# Patient Record
Sex: Female | Born: 1940 | Race: White | Hispanic: No | Marital: Married | Smoking: Never smoker
Health system: Southern US, Community
[De-identification: ages and names within clinical notes are randomized; demographics above are authoritative.]

## PROBLEM LIST (undated history)

## (undated) DIAGNOSIS — Z780 Asymptomatic menopausal state: Secondary | ICD-10-CM

## (undated) DIAGNOSIS — Z79890 Hormone replacement therapy (postmenopausal): Secondary | ICD-10-CM

## (undated) DIAGNOSIS — E785 Hyperlipidemia, unspecified: Secondary | ICD-10-CM

## (undated) DIAGNOSIS — E079 Disorder of thyroid, unspecified: Secondary | ICD-10-CM

## (undated) DIAGNOSIS — M8580 Other specified disorders of bone density and structure, unspecified site: Secondary | ICD-10-CM

## (undated) HISTORY — PX: ABDOMINAL HYSTERECTOMY: SHX81

## (undated) HISTORY — DX: Hyperlipidemia, unspecified: E78.5

## (undated) HISTORY — DX: Asymptomatic menopausal state: Z78.0

## (undated) HISTORY — PX: SPINE SURGERY: SHX786

## (undated) HISTORY — DX: Hormone replacement therapy: Z79.890

## (undated) HISTORY — DX: Disorder of thyroid, unspecified: E07.9

## (undated) HISTORY — DX: Other specified disorders of bone density and structure, unspecified site: M85.80

---

## 1998-03-25 ENCOUNTER — Ambulatory Visit (HOSPITAL_COMMUNITY): Admission: RE | Admit: 1998-03-25 | Discharge: 1998-03-25 | Payer: Self-pay | Admitting: *Deleted

## 1999-08-15 ENCOUNTER — Ambulatory Visit (HOSPITAL_COMMUNITY): Admission: RE | Admit: 1999-08-15 | Discharge: 1999-08-15 | Payer: Self-pay | Admitting: *Deleted

## 1999-08-15 ENCOUNTER — Encounter: Payer: Self-pay | Admitting: *Deleted

## 1999-09-08 ENCOUNTER — Other Ambulatory Visit: Admission: RE | Admit: 1999-09-08 | Discharge: 1999-09-08 | Payer: Self-pay | Admitting: *Deleted

## 2000-08-20 ENCOUNTER — Ambulatory Visit (HOSPITAL_COMMUNITY): Admission: RE | Admit: 2000-08-20 | Discharge: 2000-08-20 | Payer: Self-pay | Admitting: Internal Medicine

## 2000-08-20 ENCOUNTER — Encounter: Payer: Self-pay | Admitting: Internal Medicine

## 2001-09-09 ENCOUNTER — Ambulatory Visit (HOSPITAL_COMMUNITY): Admission: RE | Admit: 2001-09-09 | Discharge: 2001-09-09 | Payer: Self-pay | Admitting: Internal Medicine

## 2001-09-09 ENCOUNTER — Encounter: Payer: Self-pay | Admitting: Internal Medicine

## 2002-10-19 ENCOUNTER — Encounter: Payer: Self-pay | Admitting: Internal Medicine

## 2002-10-19 ENCOUNTER — Ambulatory Visit (HOSPITAL_COMMUNITY): Admission: RE | Admit: 2002-10-19 | Discharge: 2002-10-19 | Payer: Self-pay | Admitting: Internal Medicine

## 2003-10-26 ENCOUNTER — Ambulatory Visit (HOSPITAL_COMMUNITY): Admission: RE | Admit: 2003-10-26 | Discharge: 2003-10-26 | Payer: Self-pay | Admitting: Internal Medicine

## 2004-08-30 ENCOUNTER — Ambulatory Visit: Payer: Self-pay | Admitting: Internal Medicine

## 2004-09-15 ENCOUNTER — Ambulatory Visit: Payer: Self-pay | Admitting: Internal Medicine

## 2004-10-10 ENCOUNTER — Ambulatory Visit: Payer: Self-pay | Admitting: Internal Medicine

## 2004-11-13 ENCOUNTER — Ambulatory Visit: Payer: Self-pay | Admitting: Internal Medicine

## 2005-01-22 ENCOUNTER — Ambulatory Visit: Payer: Self-pay | Admitting: Internal Medicine

## 2005-01-23 ENCOUNTER — Ambulatory Visit (HOSPITAL_COMMUNITY): Admission: RE | Admit: 2005-01-23 | Discharge: 2005-01-23 | Payer: Self-pay | Admitting: Internal Medicine

## 2005-08-07 ENCOUNTER — Ambulatory Visit: Payer: Self-pay | Admitting: Internal Medicine

## 2005-12-27 ENCOUNTER — Ambulatory Visit: Payer: Self-pay | Admitting: Internal Medicine

## 2006-01-03 ENCOUNTER — Ambulatory Visit: Payer: Self-pay | Admitting: Internal Medicine

## 2006-01-10 ENCOUNTER — Encounter: Admission: RE | Admit: 2006-01-10 | Discharge: 2006-01-10 | Payer: Self-pay | Admitting: Internal Medicine

## 2006-01-29 ENCOUNTER — Encounter: Admission: RE | Admit: 2006-01-29 | Discharge: 2006-01-29 | Payer: Self-pay | Admitting: Internal Medicine

## 2006-07-02 ENCOUNTER — Other Ambulatory Visit: Admission: RE | Admit: 2006-07-02 | Discharge: 2006-07-02 | Payer: Self-pay | Admitting: Internal Medicine

## 2006-07-02 ENCOUNTER — Ambulatory Visit: Payer: Self-pay | Admitting: Internal Medicine

## 2006-07-02 ENCOUNTER — Ambulatory Visit (HOSPITAL_COMMUNITY): Admission: RE | Admit: 2006-07-02 | Discharge: 2006-07-02 | Payer: Self-pay | Admitting: Internal Medicine

## 2006-07-08 ENCOUNTER — Ambulatory Visit: Payer: Self-pay | Admitting: Internal Medicine

## 2007-06-19 DIAGNOSIS — M949 Disorder of cartilage, unspecified: Secondary | ICD-10-CM

## 2007-06-19 DIAGNOSIS — M899 Disorder of bone, unspecified: Secondary | ICD-10-CM | POA: Insufficient documentation

## 2007-06-19 DIAGNOSIS — E039 Hypothyroidism, unspecified: Secondary | ICD-10-CM | POA: Insufficient documentation

## 2007-06-19 DIAGNOSIS — E785 Hyperlipidemia, unspecified: Secondary | ICD-10-CM

## 2007-07-16 ENCOUNTER — Telehealth: Payer: Self-pay | Admitting: Internal Medicine

## 2007-07-18 ENCOUNTER — Ambulatory Visit: Payer: Self-pay | Admitting: Internal Medicine

## 2007-07-18 LAB — CONVERTED CEMR LAB
Basophils Absolute: 0 10*3/uL (ref 0.0–0.1)
Bilirubin, Direct: 0.1 mg/dL (ref 0.0–0.3)
Calcium: 8.7 mg/dL (ref 8.4–10.5)
Chloride: 108 meq/L (ref 96–112)
Cholesterol: 196 mg/dL (ref 0–200)
Creatinine, Ser: 0.8 mg/dL (ref 0.4–1.2)
Direct LDL: 108.6 mg/dL
Eosinophils Relative: 2.8 % (ref 0.0–5.0)
GFR calc Af Amer: 92 mL/min
GFR calc non Af Amer: 76 mL/min
Glucose, Bld: 104 mg/dL — ABNORMAL HIGH (ref 70–99)
HCT: 37.6 % (ref 36.0–46.0)
Hemoglobin: 12.9 g/dL (ref 12.0–15.0)
Lymphocytes Relative: 33.3 % (ref 12.0–46.0)
Monocytes Relative: 8.7 % (ref 3.0–11.0)
Neutrophils Relative %: 54.8 % (ref 43.0–77.0)
Nitrite: NEGATIVE
Platelets: 223 10*3/uL (ref 150–400)
Total CHOL/HDL Ratio: 4.7
Triglycerides: 253 mg/dL (ref 0–149)
WBC: 7.3 10*3/uL (ref 4.5–10.5)

## 2007-08-05 ENCOUNTER — Ambulatory Visit: Payer: Self-pay | Admitting: Internal Medicine

## 2007-09-12 IMAGING — CR DG FOOT COMPLETE 3+V*L*
3 series · 3 of 3 positions shown · non-contrast
Comparison: None.

CLINICAL DATA: Post fall injury with lateral left foot pain and swelling.
 LEFT FOOT, THREE VIEWS:

[view not recorded (1 of 3)]
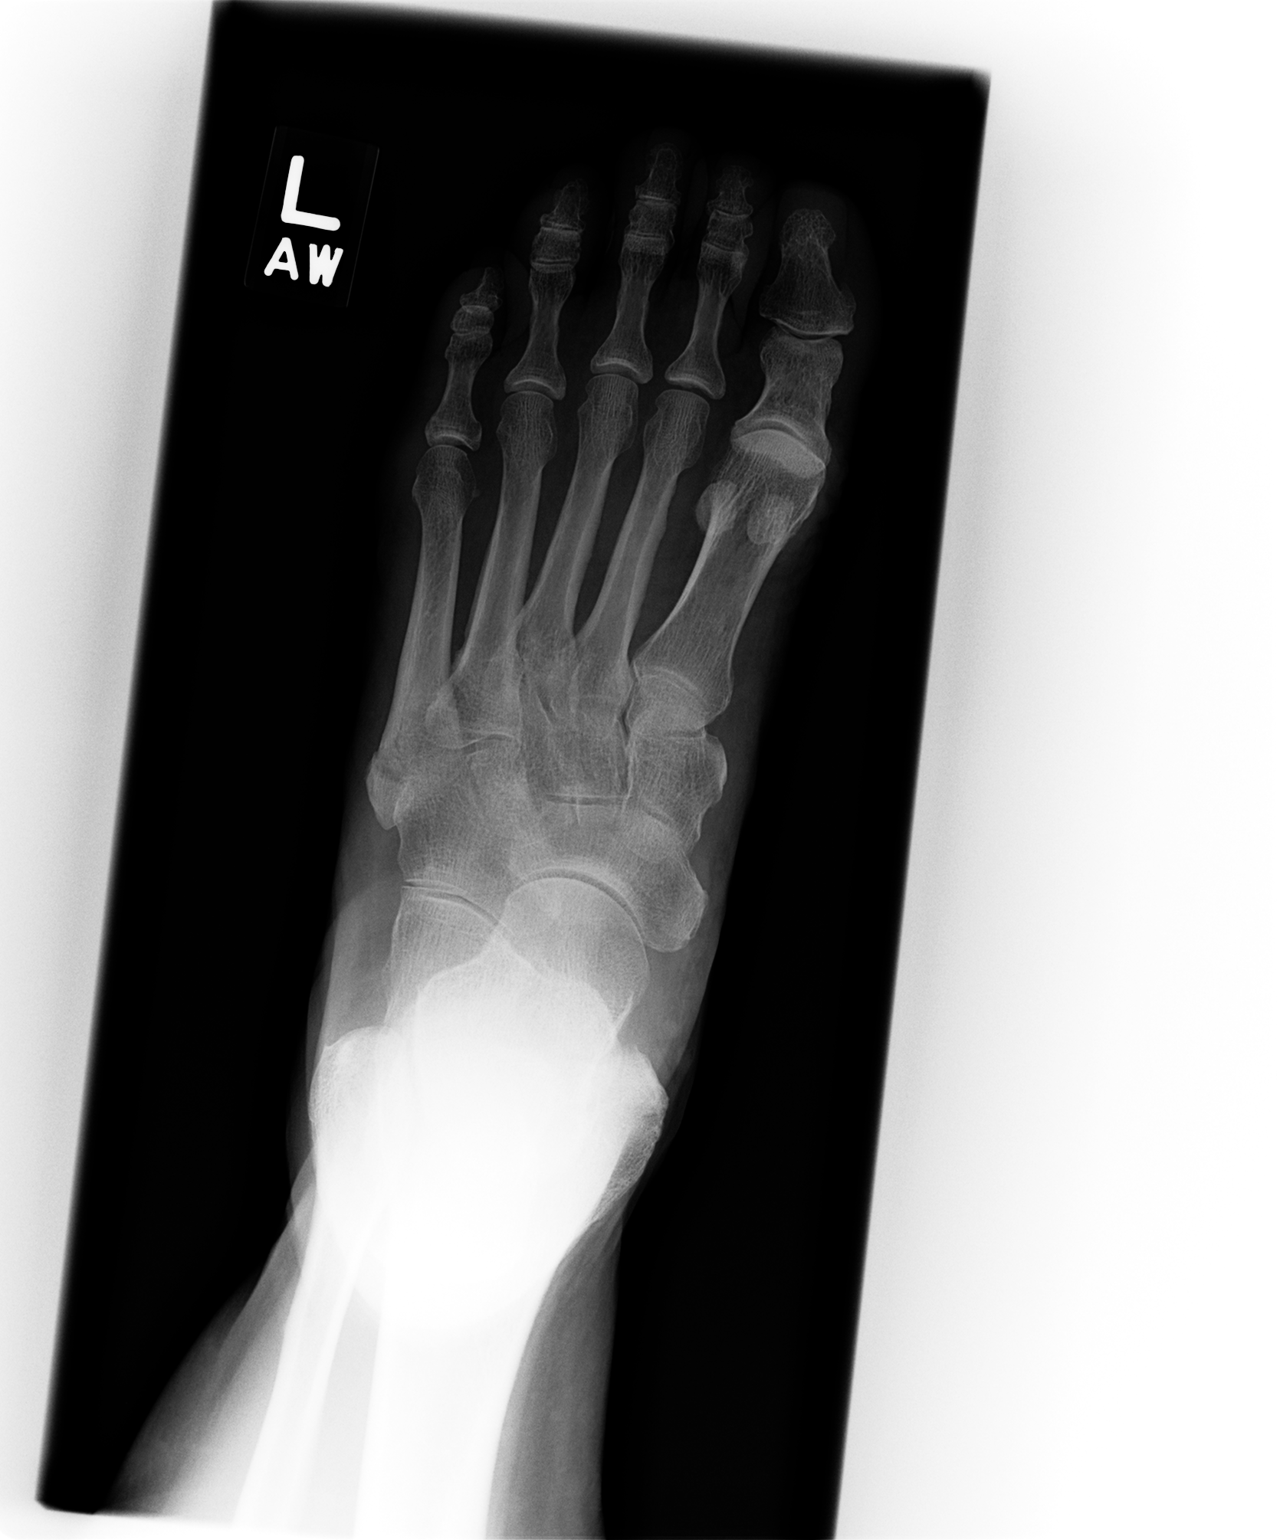

[view not recorded (2 of 3)]
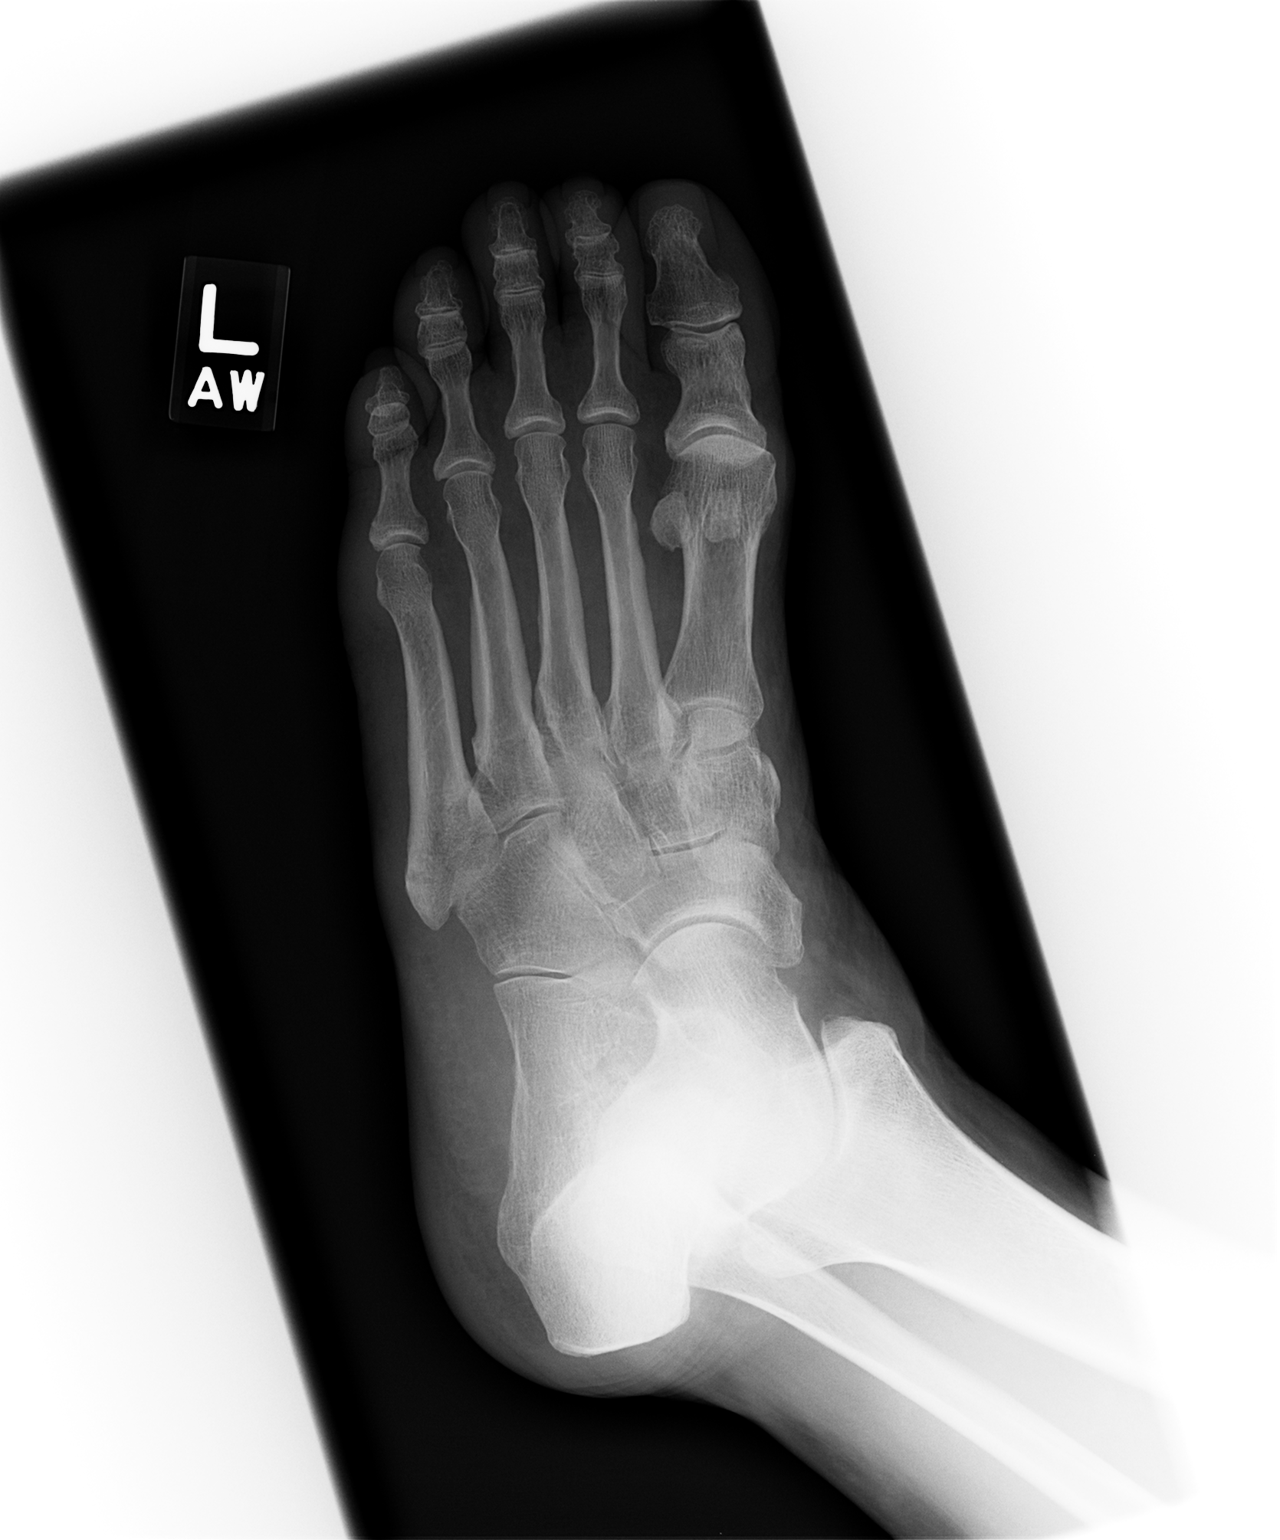

[view not recorded (3 of 3)]
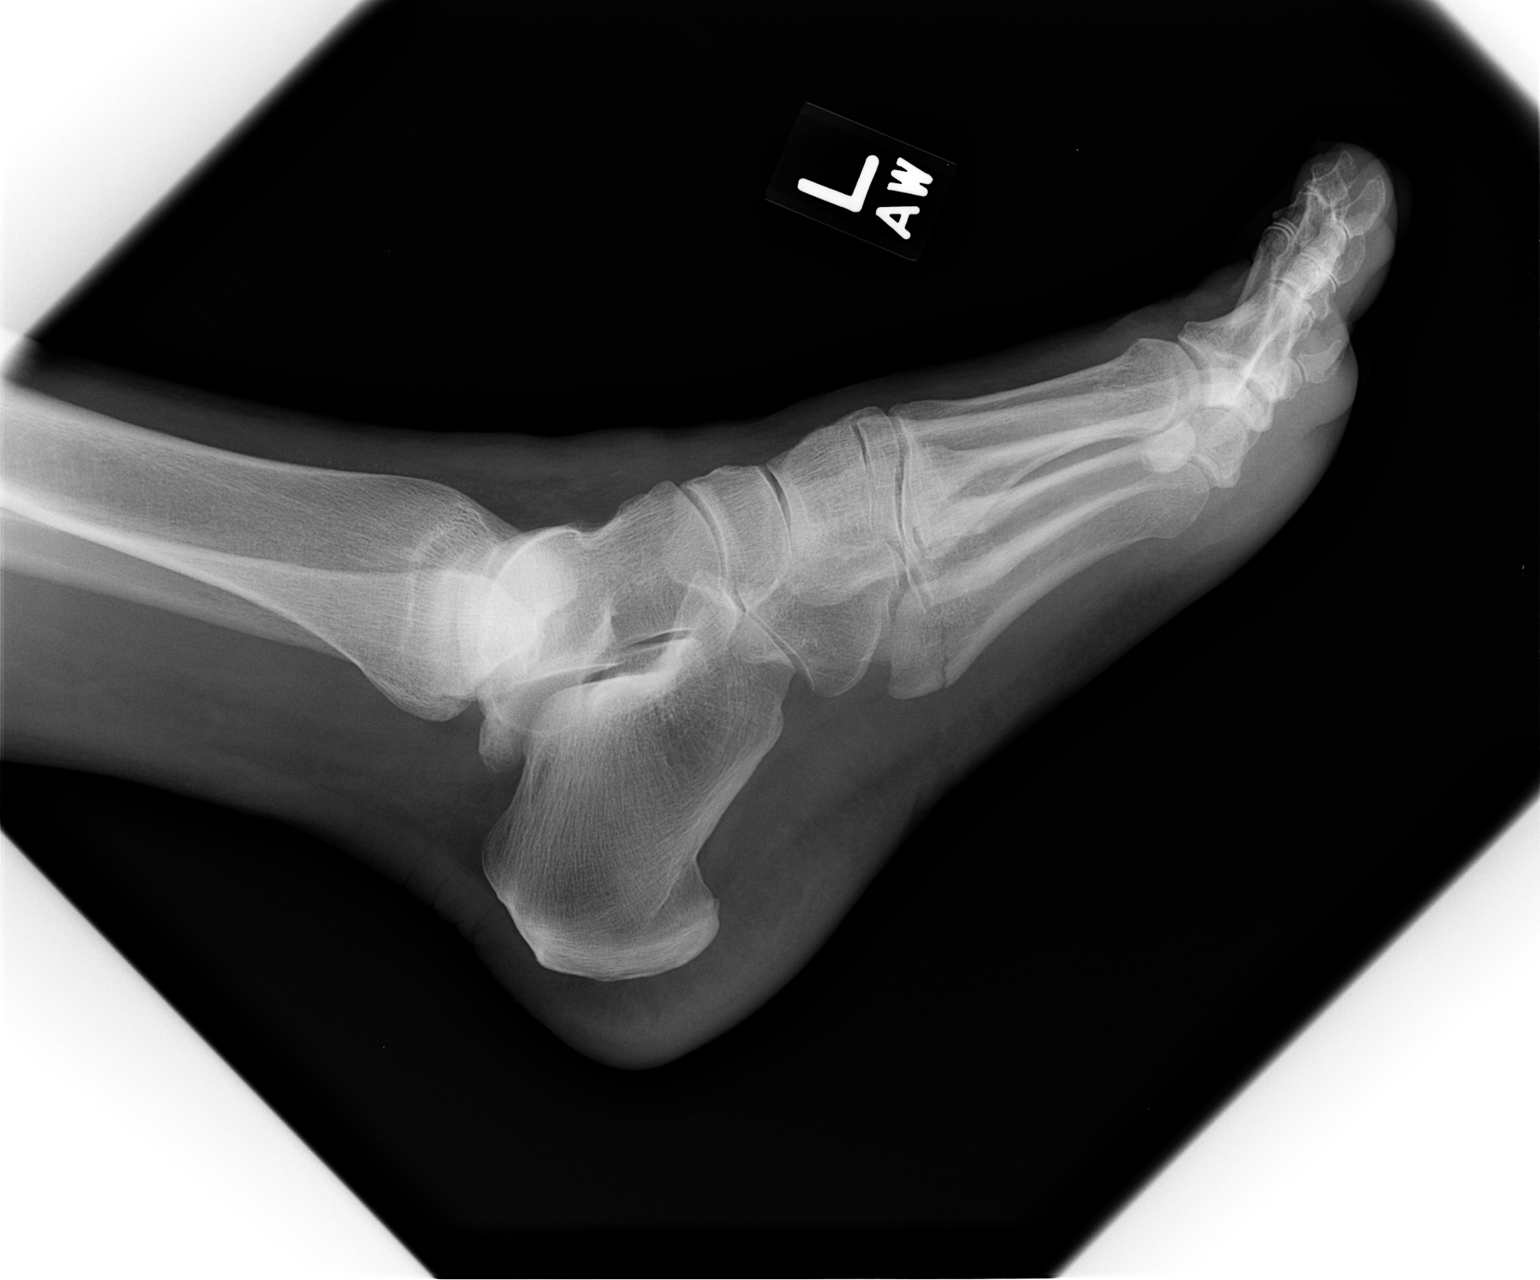

[3 of 3 positions shown; findings below may reference images not displayed]

FINDINGS: Nondisplaced acute fracture is seen at the base of the left fifth metatarsal.  No other significant abnormality is seen.
IMPRESSION: 1.  Nondisplaced left fifth base of metatarsal fracture.
 2.  Otherwise no significant abnormality.

## 2007-11-05 ENCOUNTER — Telehealth: Payer: Self-pay | Admitting: Internal Medicine

## 2007-11-05 ENCOUNTER — Encounter: Payer: Self-pay | Admitting: Internal Medicine

## 2007-11-06 ENCOUNTER — Encounter: Payer: Self-pay | Admitting: Internal Medicine

## 2007-11-10 ENCOUNTER — Encounter: Payer: Self-pay | Admitting: Internal Medicine

## 2007-11-20 ENCOUNTER — Encounter: Payer: Self-pay | Admitting: Internal Medicine

## 2007-12-22 ENCOUNTER — Telehealth: Payer: Self-pay | Admitting: Internal Medicine

## 2007-12-24 ENCOUNTER — Telehealth: Payer: Self-pay | Admitting: Internal Medicine

## 2008-04-05 ENCOUNTER — Telehealth: Payer: Self-pay | Admitting: Internal Medicine

## 2008-04-08 ENCOUNTER — Ambulatory Visit: Payer: Self-pay | Admitting: Internal Medicine

## 2008-04-08 DIAGNOSIS — IMO0002 Reserved for concepts with insufficient information to code with codable children: Secondary | ICD-10-CM

## 2008-04-08 DIAGNOSIS — T887XXA Unspecified adverse effect of drug or medicament, initial encounter: Secondary | ICD-10-CM | POA: Insufficient documentation

## 2008-04-23 ENCOUNTER — Encounter: Payer: Self-pay | Admitting: Internal Medicine

## 2008-04-23 ENCOUNTER — Encounter: Admission: RE | Admit: 2008-04-23 | Discharge: 2008-04-23 | Payer: Self-pay | Admitting: Internal Medicine

## 2008-04-26 ENCOUNTER — Telehealth: Payer: Self-pay | Admitting: Internal Medicine

## 2008-04-29 ENCOUNTER — Telehealth: Payer: Self-pay | Admitting: Internal Medicine

## 2008-05-04 ENCOUNTER — Encounter: Payer: Self-pay | Admitting: Internal Medicine

## 2008-05-28 ENCOUNTER — Encounter: Payer: Self-pay | Admitting: Internal Medicine

## 2008-06-04 ENCOUNTER — Ambulatory Visit: Payer: Self-pay | Admitting: Internal Medicine

## 2008-06-04 DIAGNOSIS — B070 Plantar wart: Secondary | ICD-10-CM

## 2008-06-04 DIAGNOSIS — G4700 Insomnia, unspecified: Secondary | ICD-10-CM | POA: Insufficient documentation

## 2008-08-10 ENCOUNTER — Ambulatory Visit: Payer: Self-pay | Admitting: Internal Medicine

## 2008-08-10 LAB — CONVERTED CEMR LAB
Alkaline Phosphatase: 72 units/L (ref 39–117)
Basophils Absolute: 0.1 10*3/uL (ref 0.0–0.1)
CO2: 31 meq/L (ref 19–32)
Cholesterol: 178 mg/dL (ref 0–200)
Creatinine, Ser: 0.7 mg/dL (ref 0.4–1.2)
Eosinophils Relative: 2.4 % (ref 0.0–5.0)
GFR calc Af Amer: 107 mL/min
Glucose, Bld: 91 mg/dL (ref 70–99)
HCT: 40.3 % (ref 36.0–46.0)
Hemoglobin: 13.4 g/dL (ref 12.0–15.0)
LDL Cholesterol: 94 mg/dL (ref 0–99)
Lymphocytes Relative: 30.3 % (ref 12.0–46.0)
Monocytes Relative: 7.8 % (ref 3.0–12.0)
Neutro Abs: 4.1 10*3/uL (ref 1.4–7.7)
Potassium: 4.5 meq/L (ref 3.5–5.1)
RBC: 5.01 M/uL (ref 3.87–5.11)
Sodium: 144 meq/L (ref 135–145)
TSH: 1.7 microintl units/mL (ref 0.35–5.50)
Total CHOL/HDL Ratio: 4
Total Protein: 7.2 g/dL (ref 6.0–8.3)
Triglycerides: 200 mg/dL — ABNORMAL HIGH (ref 0–149)
VLDL: 40 mg/dL (ref 0–40)
WBC: 7 10*3/uL (ref 4.5–10.5)

## 2008-08-30 ENCOUNTER — Ambulatory Visit (HOSPITAL_COMMUNITY): Admission: RE | Admit: 2008-08-30 | Discharge: 2008-08-30 | Payer: Self-pay | Admitting: Specialist

## 2008-09-20 ENCOUNTER — Ambulatory Visit: Payer: Self-pay | Admitting: Gastroenterology

## 2008-10-11 ENCOUNTER — Ambulatory Visit: Payer: Self-pay | Admitting: Gastroenterology

## 2008-11-25 ENCOUNTER — Telehealth: Payer: Self-pay | Admitting: Internal Medicine

## 2009-02-21 ENCOUNTER — Ambulatory Visit: Payer: Self-pay | Admitting: Internal Medicine

## 2009-02-21 DIAGNOSIS — R030 Elevated blood-pressure reading, without diagnosis of hypertension: Secondary | ICD-10-CM

## 2009-02-21 DIAGNOSIS — R635 Abnormal weight gain: Secondary | ICD-10-CM

## 2009-02-28 LAB — CONVERTED CEMR LAB
Chloride: 109 meq/L (ref 96–112)
Creatinine, Ser: 0.7 mg/dL (ref 0.4–1.2)
Free T4: 0.9 ng/dL (ref 0.6–1.6)
TSH: 1.43 microintl units/mL (ref 0.35–5.50)

## 2009-03-21 ENCOUNTER — Ambulatory Visit: Payer: Self-pay | Admitting: Internal Medicine

## 2009-05-23 ENCOUNTER — Ambulatory Visit: Payer: Self-pay | Admitting: Internal Medicine

## 2009-05-23 DIAGNOSIS — C4490 Unspecified malignant neoplasm of skin, unspecified: Secondary | ICD-10-CM

## 2009-05-30 ENCOUNTER — Ambulatory Visit: Payer: Self-pay | Admitting: Internal Medicine

## 2009-05-30 DIAGNOSIS — D179 Benign lipomatous neoplasm, unspecified: Secondary | ICD-10-CM | POA: Insufficient documentation

## 2009-05-30 DIAGNOSIS — C439 Malignant melanoma of skin, unspecified: Secondary | ICD-10-CM | POA: Insufficient documentation

## 2009-06-14 ENCOUNTER — Encounter: Payer: Self-pay | Admitting: Internal Medicine

## 2009-06-24 ENCOUNTER — Telehealth: Payer: Self-pay | Admitting: Internal Medicine

## 2009-08-31 ENCOUNTER — Ambulatory Visit (HOSPITAL_COMMUNITY): Admission: RE | Admit: 2009-08-31 | Discharge: 2009-08-31 | Payer: Self-pay | Admitting: Internal Medicine

## 2009-09-06 ENCOUNTER — Ambulatory Visit: Payer: Self-pay | Admitting: Internal Medicine

## 2009-09-06 DIAGNOSIS — L570 Actinic keratosis: Secondary | ICD-10-CM

## 2009-09-07 LAB — CONVERTED CEMR LAB
ALT: 25 units/L (ref 0–35)
AST: 25 units/L (ref 0–37)
Albumin: 4.1 g/dL (ref 3.5–5.2)
Alkaline Phosphatase: 58 units/L (ref 39–117)
BUN: 12 mg/dL (ref 6–23)
Basophils Relative: 0.7 % (ref 0.0–3.0)
Bilirubin, Direct: 0 mg/dL (ref 0.0–0.3)
CO2: 30 meq/L (ref 19–32)
Cholesterol: 201 mg/dL — ABNORMAL HIGH (ref 0–200)
Eosinophils Relative: 2.3 % (ref 0.0–5.0)
GFR calc non Af Amer: 88.29 mL/min (ref >60)
Glucose, Bld: 92 mg/dL (ref 70–99)
Lymphs Abs: 2.5 10*3/uL (ref 0.7–4.0)
MCHC: 33 g/dL (ref 30.0–36.0)
Monocytes Relative: 6.9 % (ref 3.0–12.0)
Neutro Abs: 3.8 10*3/uL (ref 1.4–7.7)
Neutrophils Relative %: 54.5 % (ref 43.0–77.0)
Potassium: 3.8 meq/L (ref 3.5–5.1)
RDW: 12.6 % (ref 11.5–14.6)
Sodium: 144 meq/L (ref 135–145)
Total Protein: 7.2 g/dL (ref 6.0–8.3)
WBC: 7 10*3/uL (ref 4.5–10.5)

## 2010-02-03 ENCOUNTER — Ambulatory Visit: Payer: Self-pay | Admitting: Internal Medicine

## 2010-02-03 LAB — CONVERTED CEMR LAB
BUN: 18 mg/dL (ref 6–23)
CO2: 30 meq/L (ref 19–32)
Calcium: 9.3 mg/dL (ref 8.4–10.5)
Chloride: 106 meq/L (ref 96–112)
GFR calc non Af Amer: 65.98 mL/min (ref >60)
Glucose, Bld: 95 mg/dL (ref 70–99)
Potassium: 4.9 meq/L (ref 3.5–5.1)
Sodium: 143 meq/L (ref 135–145)
Total Protein: 7.3 g/dL (ref 6.0–8.3)

## 2010-02-10 ENCOUNTER — Ambulatory Visit: Payer: Self-pay | Admitting: Internal Medicine

## 2010-06-14 ENCOUNTER — Ambulatory Visit: Payer: Self-pay | Admitting: Family Medicine

## 2010-07-06 ENCOUNTER — Ambulatory Visit: Payer: Self-pay | Admitting: Internal Medicine

## 2010-08-17 ENCOUNTER — Encounter: Payer: Self-pay | Admitting: Internal Medicine

## 2010-09-07 ENCOUNTER — Ambulatory Visit: Payer: Self-pay | Admitting: Internal Medicine

## 2010-09-07 LAB — CONVERTED CEMR LAB
ALT: 25 units/L (ref 0–35)
BUN: 18 mg/dL (ref 6–23)
Basophils Absolute: 0 10*3/uL (ref 0.0–0.1)
CO2: 30 meq/L (ref 19–32)
Calcium: 9.1 mg/dL (ref 8.4–10.5)
Chloride: 103 meq/L (ref 96–112)
Creatinine, Ser: 0.8 mg/dL (ref 0.4–1.2)
Eosinophils Absolute: 0.2 10*3/uL (ref 0.0–0.7)
GFR calc non Af Amer: 78.86 mL/min (ref >60)
HDL: 47.6 mg/dL (ref >39.00)
Hemoglobin: 13.4 g/dL (ref 12.0–15.0)
LDL Cholesterol: 102 mg/dL — ABNORMAL HIGH (ref 0–99)
Lymphocytes Relative: 30.7 % (ref 12.0–46.0)
Monocytes Absolute: 0.5 10*3/uL (ref 0.1–1.0)
Monocytes Relative: 7.4 % (ref 3.0–12.0)
Neutro Abs: 4.1 10*3/uL (ref 1.4–7.7)
Neutrophils Relative %: 58.8 % (ref 43.0–77.0)
Platelets: 213 10*3/uL (ref 150.0–400.0)
Potassium: 4.5 meq/L (ref 3.5–5.1)
RBC: 4.96 M/uL (ref 3.87–5.11)
RDW: 14.2 % (ref 11.5–14.6)
Total Bilirubin: 0.4 mg/dL (ref 0.3–1.2)
Total CHOL/HDL Ratio: 4
Total Protein: 6.8 g/dL (ref 6.0–8.3)
VLDL: 32 mg/dL (ref 0.0–40.0)
Vit D, 25-Hydroxy: 36 ng/mL (ref 30–89)

## 2010-10-30 ENCOUNTER — Telehealth: Payer: Self-pay | Admitting: Internal Medicine

## 2010-11-05 LAB — CONVERTED CEMR LAB: HDL goal, serum: 40 mg/dL

## 2010-11-06 ENCOUNTER — Telehealth: Payer: Self-pay | Admitting: Internal Medicine

## 2010-11-09 NOTE — Assessment & Plan Note (Signed)
Summary: check wart//ccm   Vital Signs:  Patient profile:   70 year old female Height:      67 inches (170.18 cm) Weight:      185 pounds (84.09 kg) O2 Sat:      97 % on Room air Temp:     98.8 degrees F (37.11 degrees C) oral Pulse rate:   87 / minute BP sitting:   116 / 80  (left arm) Cuff size:   large  Vitals Entered By: Josph Macho RMA (June 14, 2010 11:25 AM)  O2 Flow:  Room air CC: Check wart on left foot/ been treated twice already/ CF Is Patient Diabetic? No   History of Present Illness: Patient in today to have a Plantar Wart on her left foot treated, she was out of town and another doctor has applied crytherapy to the foot twice so far but only with a disposable cryo system.  The wart is painful but not worsening. No redness/warmth/f/c. No recent illness/cp/palp/SOB or other concerns.  Current Medications (verified): 1)  Synthroid 50 Mcg  Tabs (Levothyroxine Sodium) .... Take 1 Tablet By Mouth Once A Day 2)  Aspirin 81 Mg  Tbec (Aspirin) .... Once Daily 3)  Calcium-Vitamin D 600-200 Mg-Unit  Tabs (Calcium-Vitamin D) .... Once Daily 4)  Sertraline Hcl 50 Mg Tabs (Sertraline Hcl) .... One By Mouth Daily 5)  Simvastatin 40 Mg Tabs (Simvastatin) .... One By Mouth Daily  Allergies (verified): 1)  ! Codeine Sulfate (Codeine Sulfate)  Past History:  Past medical history reviewed for relevance to current acute and chronic problems. Social history (including risk factors) reviewed for relevance to current acute and chronic problems.  Past Medical History: Reviewed history from 06/19/2007 and no changes required. Hyperlipidemia Hypothyroidism Osteopenia menopause HRT  Social History: Reviewed history from 08/05/2007 and no changes required. Married Never Smoked Alcohol use-yes Drug use-no Regular exercise-yes  Review of Systems      See HPI  Physical Exam  General:  Well-developed,well-nourished,in no acute distress; alert,appropriate and  cooperative throughout examination Mouth:  Oral mucosa and oropharynx without lesions or exudates.  Teeth in good repair. Lungs:  Normal respiratory effort, chest expands symmetrically. Lungs are clear to auscultation, no crackles or wheezes. Heart:  Normal rate and regular rhythm. S1 and S2 normal without gallop, murmur, click, rub or other extra sounds. Abdomen:  Bowel sounds positive,abdomen soft and non-tender without masses, organomegaly or hernias noted. Extremities:  3-34mm plantar wart noted at base of her 3rd toe on left foot Psych:  Cognition and judgment appear intact. Alert and cooperative with normal attention span and concentration. No apparent delusions, illusions, hallucinations   Impression & Recommendations:  Problem # 1:  PLANTAR WART, LEFT (ICD-078.12)  cleaned well with alcohol and subjected to crytherapy, patient tolerated well, will apply Compound W and duct tape at bedtime until next visit once blistering heals. Add Folic Acid daily and subject to cryotherapy again in 2 weeks, if no response after a couple more treatments will refer for surgical excision  Orders: Cryotherapy/Destruction benign or premalignant lesion (1st lesion)  (17000)  Problem # 2:  ELEVATED BLOOD PRESSURE WITHOUT DIAGNOSIS OF HYPERTENSION (ICD-796.2) Well controlled  at present  Complete Medication List: 1)  Synthroid 50 Mcg Tabs (Levothyroxine sodium) .... Take 1 tablet by mouth once a day 2)  Aspirin 81 Mg Tbec (Aspirin) .... Once daily 3)  Calcium-vitamin D 600-200 Mg-unit Tabs (Calcium-vitamin d) .... Once daily 4)  Sertraline Hcl 50 Mg Tabs (Sertraline hcl) .Marland KitchenMarland KitchenMarland Kitchen  One by mouth daily 5)  Simvastatin 40 Mg Tabs (Simvastatin) .... One by mouth daily  Patient Instructions: 1)  Please schedule a follow-up appointment in 2 weeks.  2)  after the blistering subsides apply Compound W at bedtime and cover with duct tape daily until seen again 3)  Continue MVI and add Folic Acid 800 micrograms daily  while treating the plantar wart

## 2010-11-09 NOTE — Assessment & Plan Note (Signed)
Summary: MEDICARE CPX//SLM/pt rescd from bump//ccm   Vital Signs:  Patient profile:   70 year old female Height:      67 inches Weight:      188 pounds BMI:     29.55 Temp:     98.2 degrees F oral Pulse rate:   72 / minute Resp:     14 per minute BP sitting:   130 / 80  (left arm)  Vitals Entered By: Willy Eddy, LPN (September 07, 2010 9:00 AM)  Contraindications/Deferment of Procedures/Staging:    Test/Procedure: PAP Smear    Reason for deferment: hysterectomy  CC: annual visit for disease management-fasting this am--mmammogram is due, Lipid Management Is Patient Diabetic? No   Primary Care [place]:  Stacie Glaze MD  CC:  annual visit for disease management-fasting this am--mmammogram is due and Lipid Management.  History of Present Illness: I have personally reviewed the Medicare Annual Wellness questionnaire and have noted 1.   The patient's medical and social history 2.   Their use of alcohol, tobacco or illicit drugs 3.   Their current medications and supplements 4.   The patient's functional ability including ADL's, fall risks, home safety risks and hearing or visual             impairment. 5.   Diet and physical activities 6.   Evidence for depression or mood disorders The patients weight, height, BMI and visual acuity have been recorded in the chart I have made referrals, counseling and provided education to the patient based review of the above and I have provided the pt with a written personalized care plan for preventive services.  and follow up for lipids  and on vitamin d replacement gained weight and needs to folow diet better with increased exercize  Lipid Management History:      Positive NCEP/ATP III risk factors include female age 48 years old or older and family history for ischemic heart disease (females less than 72 years old & males less than 89 years old).  Negative NCEP/ATP III risk factors include non-tobacco-user status, non-hypertensive,  no ASHD (atherosclerotic heart disease), no prior stroke/TIA, no peripheral vascular disease, and no history of aortic aneurysm.     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) 3)  Lipoma  (ICD-214.9) 4)  Carcinoma, Basal Cell  (ICD-173.9) 5)  Elevated Blood Pressure Without Diagnosis of Hypertension  (ICD-796.2) 6)  Weight Gain, Abnormal  (ICD-783.1) 7)  Uns Advrs Eff Uns Rx Medicinal&biological Sbstnc  (ICD-995.20) 8)  Plantar Wart, Left  (ICD-078.12) 9)  Insomnia  (ICD-780.52) 10)  Uns Advrs Eff Uns Rx Medicinal&biological Sbstnc  (ICD-995.20) 11)  Lumbar Radiculopathy, Right  (ICD-724.4) 12)  Family History of Cad Female 1st Degree Relative <60  (ICD-V16.49) 13)  Family History of Cad Female 1st Degree Relative <50  (ICD-V17.3) 14)  Physical Examination  (ICD-V70.0) 15)  Hrt  (ICD-V07.4) 16)  Osteopenia  (ICD-733.90) 17)  Hypothyroidism  (ICD-244.9) 18)  Hyperlipidemia  (ICD-272.4)  Current Problems (verified): 1)  Actinic Keratosis  (ICD-702.0) 2)  Melanoma, 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) 3)  Lipoma  (ICD-214.9) 4)  Carcinoma, Basal Cell  (ICD-173.9) 5)  Elevated Blood Pressure Without Diagnosis of Hypertension  (ICD-796.2) 6)  Weight Gain, Abnormal  (ICD-783.1) 7)  Uns Advrs Eff Uns Rx Medicinal&biological Sbstnc  (ICD-995.20) 8)  Plantar Wart, Left  (ICD-078.12) 9)  Insomnia  (ICD-780.52) 10)  Uns Advrs Eff Uns Rx Medicinal&biological Sbstnc  (ICD-995.20) 11)  Lumbar Radiculopathy, Right  (ICD-724.4) 12)  Family History of Cad Female 1st Degree Relative <60  (ICD-V16.49) 13)  Family History of Cad Female 1st Degree Relative <50  (ICD-V17.3) 14)  Physical Examination  (ICD-V70.0) 15)  Hrt  (ICD-V07.4) 16)  Osteopenia  (ICD-733.90) 17)  Hypothyroidism   (ICD-244.9) 18)  Hyperlipidemia  (ICD-272.4)  Medications Prior to Update: 1)  Synthroid 50 Mcg  Tabs (Levothyroxine Sodium) .... Take 1 Tablet By Mouth Once A Day 2)  Aspirin 81 Mg  Tbec (Aspirin) .... Once Daily 3)  Calcium-Vitamin D 600-200 Mg-Unit  Tabs (Calcium-Vitamin D) .... Once Daily 4)  Sertraline Hcl 50 Mg Tabs (Sertraline Hcl) .... One By Mouth Daily 5)  Simvastatin 40 Mg Tabs (Simvastatin) .... One By Mouth Daily  Current Medications (verified): 1)  Synthroid 50 Mcg  Tabs (Levothyroxine Sodium) .... Take 1 Tablet By Mouth Once A Day 2)  Aspirin 81 Mg  Tbec (Aspirin) .... Once Daily 3)  Calcium-Vitamin D 600-200 Mg-Unit  Tabs (Calcium-Vitamin D) .... Once Daily 4)  Sertraline Hcl 50 Mg Tabs (Sertraline Hcl) .... One By Mouth Daily 5)  Simvastatin 40 Mg Tabs (Simvastatin) .... One By Mouth Daily 6)  Vitamin D 1000 Unit Tabs (Cholecalciferol) .Marland Kitchen.. 1 Two Times A Day  Allergies (verified): 1)  ! Codeine Sulfate (Codeine Sulfate)  Directives (verified): 1)  Living Will   Past History:  Family History: Last updated: 08/05/2007 father  Family History of CAD Female 1st degree relative <50 mother Family History of CAD Female 1st degree relative <60 Family History Hypertension  Social History: Last updated: 08/05/2007 Married Never Smoked Alcohol use-yes Drug use-no Regular exercise-yes  Risk Factors: Alcohol Use: <1 (09/07/2010) Exercise: yes (08/05/2007)  Risk Factors: Smoking Status: never (09/07/2010) Passive Smoke Exposure: no (09/07/2010)  Past medical, surgical, family and social histories (including risk factors) reviewed, and no changes noted (except as noted below).  Past Medical History: Reviewed history from 06/19/2007 and no changes required. Hyperlipidemia Hypothyroidism Osteopenia menopause HRT  Past Surgical History: Reviewed history from 04/08/2008 and no changes required. Hysterectomy TAH Lumbar laminectomy  Family History: Reviewed  history from 08/05/2007 and no changes required. father  Family History of CAD Female 1st degree relative <50 mother Family History of CAD Female 1st degree relative <60 Family History Hypertension  Social History: Reviewed history from 08/05/2007 and no changes required. Married Never Smoked Alcohol use-yes Drug use-no Regular exercise-yes  Review of Systems  The patient denies anorexia, fever, weight loss, weight gain, vision loss, decreased hearing, hoarseness, chest pain, syncope, dyspnea on exertion, peripheral edema, prolonged cough, headaches, hemoptysis, abdominal pain, melena, hematochezia, severe indigestion/heartburn, hematuria, incontinence, genital sores, muscle weakness, suspicious skin lesions, transient blindness, difficulty walking, depression, unusual weight change, abnormal bleeding, enlarged lymph nodes, angioedema, and breast masses.    Physical Exam  General:  Well-developed,well-nourished,in no acute distress; alert,appropriate and cooperative throughout examination Head:  normocephalic and atraumatic.   Eyes:  pupils equal and pupils round.   Ears:  R ear normal, L ear normal, and no external deformities.   Nose:  no nasal discharge.  no mucosal edema.   Mouth:  Oral mucosa and oropharynx without lesions or exudates.  Teeth in good repair. Neck:  supple.  full ROM.   Lungs:  Normal respiratory effort, chest expands symmetrically.  Lungs are clear to auscultation, no crackles or wheezes. Heart:  Normal rate and regular rhythm. S1 and S2 normal without gallop, murmur, click, rub or other extra sounds. Abdomen:  Bowel sounds positive,abdomen soft and non-tender without masses, organomegaly or hernias noted. Msk:  normal ROM, no joint tenderness, and no joint swelling.   Extremities:  3-55mm plantar wart noted at base of her 3rd toe on left foot Neurologic:  alert & oriented X3 and finger-to-nose normal.     Impression & Recommendations:  Problem # 1:  ELEVATED  BLOOD PRESSURE WITHOUT DIAGNOSIS OF HYPERTENSION (ICD-796.2) stable BP today: 130/80 Prior BP: 116/80 (06/14/2010)  Prior 10 Yr Risk Heart Disease: 9 % (02/10/2010)  Labs Reviewed: Creat: 0.9 (02/03/2010) Chol: 183 (02/03/2010)   HDL: 49.30 (02/03/2010)   LDL: 104 (02/03/2010)   TG: 147.0 (02/03/2010)  Instructed in low sodium diet (DASH Handout) and behavior modification.    Problem # 2:  WEIGHT GAIN, ABNORMAL (ICD-783.1)  Problem # 3:  PHYSICAL EXAMINATION (ICD-V70.0)  The pt was asked about all immunizations, health maint. services that are appropriate to their age and was given guidance on diet exercize  and weight management  Mammogram: ASSESSMENT: Negative - BI-RADS 1^MM DIGITAL SCREENING (08/31/2009) Colonoscopy: Location:  [facility].   (10/11/2008) Td Booster: Td (08/10/2008)   Flu Vax: Historical (08/09/2010)   Pneumovax: Historical (10/08/2005) Chol: 183 (02/03/2010)   HDL: 49.30 (02/03/2010)   LDL: 104 (02/03/2010)   TG: 147.0 (02/03/2010) TSH: 1.41 (09/06/2009)   HgbA1C: 6.2 (02/21/2009)   Next mammogram due:: 09/2010 (09/06/2009) Next Colonoscopy due:: 10/2018 (10/11/2008)  Discussed using sunscreen, use of alcohol, drug use, self breast exam, routine dental care, routine eye care, schedule for GYN exam, routine physical exam, seat belts, multiple vitamins, osteoporosis prevention, adequate calcium intake in diet, recommendations for immunizations, mammograms and Pap smears.  Discussed exercise and checking cholesterol.  Discussed gun safety, safe sex, and contraception.  Orders: Medicare -1st Annual Wellness Visit [phone])  Problem # 4:  HYPERLIPIDEMIA (ICD-272.4)  Her updated medication list for this problem includes:    Simvastatin 40 Mg Tabs (Simvastatin) ..... One by mouth daily  Labs Reviewed: SGOT: 26 (02/03/2010)   SGPT: 29 (02/03/2010)  Lipid Goals: Chol Goal: 200 (08/05/2007)   HDL Goal: 40 (08/05/2007)   LDL Goal: 130 (08/05/2007)   TG  Goal: 150 (08/05/2007)  Prior 10 Yr Risk Heart Disease: 9 % (02/10/2010)   HDL:49.30 (02/03/2010), 48.50 (09/06/2009)  LDL:104 (02/03/2010), 94 (98/08/9146)  Chol:183 (02/03/2010), 201 (09/06/2009)  Trig:147.0 (02/03/2010), 200 (08/10/2008)  Orders: TLB-Lipid Panel (80061-LIPID)  Complete Medication List: 1)  Synthroid 50 Mcg Tabs (Levothyroxine sodium) .... Take 1 tablet by mouth once a day 2)  Aspirin 81 Mg Tbec (Aspirin) .... Once daily 3)  Calcium-vitamin D 600-200 Mg-unit Tabs (Calcium-vitamin d) .... Once daily 4)  Sertraline Hcl 50 Mg Tabs (Sertraline hcl) .... One by mouth daily 5)  Simvastatin 40 Mg Tabs (Simvastatin) .... One by mouth daily 6)  Vitamin D 1000 Unit Tabs (Cholecalciferol) .Marland Kitchen.. 1 two times a day  Other Orders: TLB-CMP (Comprehensive Metabolic Pnl) (80053-COMP) TLB-TSH (Thyroid Stimulating Hormone) (84443-TSH) T-Vitamin D (25-Hydroxy) (82956-21308) TLB-CBC Platelet - w/Differential (85025-CBCD) Venipuncture (65784)  Lipid Assessment/Plan:      Based on NCEP/ATP III, the patient's risk factor category is "2 or more risk factors and a calculated 10 year CAD risk of < 20%".  The patient's lipid goals are as follows: Total cholesterol goal is 200; LDL cholesterol goal is 130; HDL cholesterol  goal is 40; Triglyceride goal is 150.  Her LDL cholesterol goal has been met.    Patient Instructions: 1)  Please schedule a follow-up appointment in 6 months. 2)  Hepatic Panel prior to visit, ICD-9:995.20 3)  Lipid Panel prior to visit, ICD-9:272.4 4)  I have provided you with a copy of your personalized plan for preventive services. Please take the time to review along with your updated medication list.   Orders Added: 1)  TLB-Lipid Panel [80061-LIPID] 2)  TLB-CMP (Comprehensive Metabolic Pnl) [80053-COMP] 3)  TLB-TSH (Thyroid Stimulating Hormone) [84443-TSH] 4)  T-Vitamin D (25-Hydroxy) [11914-78295] 5)  TLB-CBC Platelet - w/Differential [85025-CBCD] 6)  Venipuncture  [62130] 7)  Medicare -1st Annual Wellness Visit [G0438] 8)  Est. Patient Level III [86578]   Immunization History:  Influenza Immunization History:    Influenza:  historical (08/09/2010)   Immunization History:  Influenza Immunization History:    Influenza:  Historical (08/09/2010)     Prevention & Chronic Care Immunizations   Influenza vaccine: Historical  (08/09/2010)   Influenza vaccine due: 06/09/2011    Tetanus booster: 08/10/2008: Td   Tetanus booster due: 08/10/2018    Pneumococcal vaccine: Historical  (10/08/2005)   Pneumococcal vaccine deferral: Not indicated  (09/07/2010)    H. zoster vaccine: Not documented  Colorectal Screening   Hemoccult: Not documented    Colonoscopy: Location:  [facility].    (10/11/2008)   Colonoscopy due: 10/2018  Other Screening   Pap smear: Not documented   Pap smear action/deferral: hysterectomy  (09/07/2010)    Mammogram: ASSESSMENT: Negative - BI-RADS 1^MM DIGITAL SCREENING  (08/31/2009)   Mammogram action/deferral: Ordered  (09/07/2010)   Mammogram due: 09/2010    DXA bone density scan: Not documented   Smoking status: never  (09/07/2010)  Lipids   Total Cholesterol: 183  (02/03/2010)   Lipid panel action/deferral: Lipid Panel ordered   LDL: 104  (02/03/2010)   LDL Direct: 112.7  (09/06/2009)   HDL: 49.30  (02/03/2010)   Triglycerides: 147.0  (02/03/2010)    SGOT (AST): 26  (02/03/2010)   BMP action: Ordered   SGPT (ALT): 29  (02/03/2010) CMP ordered    Alkaline phosphatase: 59  (02/03/2010)   Total bilirubin: 0.5  (02/03/2010)    Lipid flowsheet reviewed?: Yes   Progress toward LDL goal: At goal  Self-Management Support :    Patient will work on the following items until the next clinic visit to reach self-care goals:     Medications and monitoring: take my medicines every day  (09/07/2010)     Activity: take a 30 minute walk every day, take the stairs instead of the elevator, park at the  far end of the parking lot, join a walking program  (09/07/2010)    Lipid self-management support: Not documented    Nursing Instructions: Schedule screening mammogram (see order)   Appended Document: Orders Update    Clinical Lists Changes  Orders: Added new Service order of Specimen Handling ([id number]) - Signed

## 2010-11-09 NOTE — Assessment & Plan Note (Signed)
Summary: 3 month rov/njr/PT [facility] FROM BMP/[facility]   Vital Signs:  Patient profile:   70 year old female Height:      67 inches Weight:      191 pounds BMI:     30.02 Temp:     98.2 degrees F oral Resp:     14 per minute BP sitting:   130 / 76  (left arm)  Vitals Entered By: Willy Eddy, LPN (Feb 10, 1609 11:41 AM) CC: roa labs, Lipid Management   CC:  roa labs and Lipid Management.  History of Present Illness: The pt presents for follow up of lipids   Hyperlipidemia Follow-Up      This is a 70 year old woman who presents for Hyperlipidemia follow-up.  The patient denies muscle aches, GI upset, abdominal pain, flushing, itching, constipation, diarrhea, and fatigue.  The patient denies the following symptoms: chest pain/pressure, exercise intolerance, dypsnea, palpitations, syncope, and pedal edema.  Compliance with medications (by patient report) has been near 100%.  Dietary compliance has been excellent.  The patient reports exercising daily.  Adjunctive measures currently used by the patient include niacin and fish oil supplements.    Lipid Management History:      Positive NCEP/ATP III risk factors include female age 40 years old or older and family history for ischemic heart disease (females less than 55 years old & males less than 1 years old).  Negative NCEP/ATP III risk factors include non-tobacco-user status, non-hypertensive, no ASHD (atherosclerotic heart disease), no prior stroke/TIA, no peripheral vascular disease, and no history of aortic aneurysm.     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) 3)  Lipoma  (ICD-214.9) 4)  Carcinoma, Basal Cell  (ICD-173.9) 5)  Elevated Blood Pressure Without Diagnosis of Hypertension  (ICD-796.2) 6)  Weight Gain, Abnormal   (ICD-783.1) 7)  Uns Advrs Eff Uns Rx Medicinal&biological Sbstnc  (ICD-995.20) 8)  Plantar Wart, Left  (ICD-078.12) 9)  Insomnia  (ICD-780.52) 10)  Uns Advrs Eff Uns Rx Medicinal&biological Sbstnc  (ICD-995.20) 11)  Lumbar Radiculopathy, Right  (ICD-724.4) 12)  Family History of Cad Female 1st Degree Relative <60  (ICD-V16.49) 13)  Family History of Cad Female 1st Degree Relative <50  (ICD-V17.3) 14)  Physical Examination  (ICD-V70.0) 15)  Hrt  (ICD-V07.4) 16)  Osteopenia  (ICD-733.90) 17)  Hypothyroidism  (ICD-244.9) 18)  Hyperlipidemia  (ICD-272.4)  Current Problems (verified): 1)  Actinic Keratosis  (ICD-702.0) 2)  Melanoma, 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) 3)  Lipoma  (ICD-214.9) 4)  Carcinoma, Basal Cell  (ICD-173.9) 5)  Elevated Blood Pressure Without Diagnosis of Hypertension  (ICD-796.2) 6)  Weight Gain, Abnormal  (ICD-783.1) 7)  Uns Advrs Eff Uns Rx Medicinal&biological Sbstnc  (ICD-995.20) 8)  Plantar Wart, Left  (ICD-078.12) 9)  Insomnia  (ICD-780.52) 10)  Uns Advrs Eff Uns Rx Medicinal&biological Sbstnc  (ICD-995.20) 11)  Lumbar Radiculopathy, Right  (ICD-724.4) 12)  Family History of Cad Female 1st Degree Relative <60  (ICD-V16.49) 13)  Family History of Cad Female 1st Degree Relative <50  (ICD-V17.3) 14)  Physical Examination  (ICD-V70.0) 15)  Hrt  (ICD-V07.4) 16)  Osteopenia  (ICD-733.90) 17)  Hypothyroidism  (ICD-244.9) 18)  Hyperlipidemia  (ICD-272.4)  Medications Prior to Update: 1)  Synthroid 50 Mcg  Tabs (Levothyroxine  Sodium) .... Take 1 Tablet By Mouth Once A Day 2)  Aspirin 81 Mg  Tbec (Aspirin) .... Once Daily 3)  Calcium-Vitamin D 600-200 Mg-Unit  Tabs (Calcium-Vitamin D) .... Once Daily 4)  Sertraline Hcl 50 Mg Tabs (Sertraline Hcl) .... One By Mouth Daily 5)  Simvastatin 40 Mg Tabs (Simvastatin) .... One By Mouth Daily  Current Medications (verified): 1)  Synthroid 50 Mcg  Tabs (Levothyroxine Sodium) .... Take 1 Tablet By Mouth Once A Day 2)  Aspirin 81  Mg  Tbec (Aspirin) .... Once Daily 3)  Calcium-Vitamin D 600-200 Mg-Unit  Tabs (Calcium-Vitamin D) .... Once Daily 4)  Sertraline Hcl 50 Mg Tabs (Sertraline Hcl) .... One By Mouth Daily 5)  Simvastatin 40 Mg Tabs (Simvastatin) .... One By Mouth Daily  Allergies (verified): 1)  ! Codeine Sulfate (Codeine Sulfate)  Past History:  Family History: Last updated: 08/05/2007 father  Family History of CAD Female 1st degree relative <50 mother Family History of CAD Female 1st degree relative <60 Family History Hypertension  Social History: Last updated: 08/05/2007 Married Never Smoked Alcohol use-yes Drug use-no Regular exercise-yes  Risk Factors: Alcohol Use: <1 (02/10/2010) Exercise: yes (08/05/2007)  Risk Factors: Smoking Status: never (02/10/2010) Passive Smoke Exposure: no (02/10/2010)  Past medical, surgical, family and social histories (including risk factors) reviewed, and no changes noted (except as noted below).  Past Medical History: Reviewed history from 06/19/2007 and no changes required. Hyperlipidemia Hypothyroidism Osteopenia menopause HRT  Past Surgical History: Reviewed history from 04/08/2008 and no changes required. Hysterectomy TAH Lumbar laminectomy  Family History: Reviewed history from 08/05/2007 and no changes required. father  Family History of CAD Female 1st degree relative <50 mother Family History of CAD Female 1st degree relative <60 Family History Hypertension  Social History: Reviewed history from 08/05/2007 and no changes required. Married Never Smoked Alcohol use-yes Drug use-no Regular exercise-yes  Review of Systems  The patient denies anorexia, fever, weight loss, weight gain, vision loss, decreased hearing, hoarseness, chest pain, syncope, dyspnea on exertion, peripheral edema, prolonged cough, headaches, hemoptysis, abdominal pain, melena, hematochezia, severe indigestion/heartburn, hematuria, incontinence, genital sores,  muscle weakness, suspicious skin lesions, transient blindness, difficulty walking, depression, unusual weight change, abnormal bleeding, enlarged lymph nodes, angioedema, and breast masses.    Physical Exam  General:  alert.  well-developed and overweight-appearing.   Head:  normocephalic and atraumatic.   Eyes:  pupils equal and pupils round.   Ears:  R ear normal, L ear normal, and no external deformities.   Nose:  no nasal discharge.  no mucosal edema.   Mouth:  pharynx pink and moist and no erythema.   Neck:  supple.  full ROM.   Lungs:  normal respiratory effort and normal breath sounds.   Heart:  normal rate and regular rhythm.   Abdomen:  soft, non-tender, and normal bowel sounds.   Msk:  normal ROM, no joint tenderness, and no joint swelling.   Extremities:  trace left pedal edema and trace right pedal edema.   Neurologic:  alert & oriented X3 and finger-to-nose normal.     Impression & Recommendations:  Problem # 1:  HYPERLIPIDEMIA (ICD-272.4) Assessment Improved  Her updated medication list for this problem includes:    Simvastatin 40 Mg Tabs (Simvastatin) ..... One by mouth daily  Labs Reviewed: SGOT: 26 (02/03/2010)   SGPT: 29 (02/03/2010)  Lipid Goals: Chol Goal: 200 (08/05/2007)   HDL Goal: 40 (08/05/2007)   LDL Goal: 130 (08/05/2007)   TG Goal: 150 (08/05/2007)  10 Yr Risk Heart Disease: 9 % Prior 10 Yr Risk Heart Disease: 8 % (05/23/2009)   HDL:49.30 (02/03/2010), 48.50 (09/06/2009)  LDL:104 (02/03/2010), 94 (04/54/0981)  Chol:183 (02/03/2010), 201 (09/06/2009)  Trig:147.0 (02/03/2010), 200 (08/10/2008)  Problem # 2:  ELEVATED BLOOD PRESSURE WITHOUT DIAGNOSIS OF HYPERTENSION (ICD-796.2) good control BP today: 130/76 Prior BP: 124/80 (09/06/2009)  10 Yr Risk Heart Disease: 9 % Prior 10 Yr Risk Heart Disease: 8 % (05/23/2009)  Labs Reviewed: Creat: 0.9 (02/03/2010) Chol: 183 (02/03/2010)   HDL: 49.30 (02/03/2010)   LDL: 104 (02/03/2010)   TG: 147.0  (02/03/2010)  Instructed in low sodium diet (DASH Handout) and behavior modification.    Complete Medication List: 1)  Synthroid 50 Mcg Tabs (Levothyroxine sodium) .... Take 1 tablet by mouth once a day 2)  Aspirin 81 Mg Tbec (Aspirin) .... Once daily 3)  Calcium-vitamin D 600-200 Mg-unit Tabs (Calcium-vitamin d) .... Once daily 4)  Sertraline Hcl 50 Mg Tabs (Sertraline hcl) .... One by mouth daily 5)  Simvastatin 40 Mg Tabs (Simvastatin) .... One by mouth daily  Lipid Assessment/Plan:      Based on NCEP/ATP III, the patient's risk factor category is "2 or more risk factors and a calculated 10 year CAD risk of < 20%".  The patient's lipid goals are as follows: Total cholesterol goal is 200; LDL cholesterol goal is 130; HDL cholesterol goal is 40; Triglyceride goal is 150.  Her LDL cholesterol goal has been met.    Patient Instructions: 1)  NOV   cpx

## 2010-11-09 NOTE — Assessment & Plan Note (Signed)
Summary: 2 wk rov/njr   Vital Signs:  Patient profile:   70 year old female Height:      67 inches Temp:     98.6 degrees F oral Pulse rate:   84 / minute Resp:     14 per minute  Vitals Entered By: Willy Eddy, LPN (July 06, 2010 12:53 PM) CC: wart tx Is Patient Diabetic? No   Primary Care Provider:  Stacie Glaze MD  CC:  wart tx.  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) 3)  Lipoma  (ICD-214.9) 4)  Carcinoma, Basal Cell  (ICD-173.9) 5)  Elevated Blood Pressure Without Diagnosis of Hypertension  (ICD-796.2) 6)  Weight Gain, Abnormal  (ICD-783.1) 7)  Uns Advrs Eff Uns Rx Medicinal&biological Sbstnc  (ICD-995.20) 8)  Plantar Wart, Left  (ICD-078.12) 9)  Insomnia  (ICD-780.52) 10)  Uns Advrs Eff Uns Rx Medicinal&biological Sbstnc  (ICD-995.20) 11)  Lumbar Radiculopathy, Right  (ICD-724.4) 12)  Family History of Cad Female 1st Degree Relative <60  (ICD-V16.49) 13)  Family History of Cad Female 1st Degree Relative <50  (ICD-V17.3) 14)  Physical Examination  (ICD-V70.0) 15)  Hrt  (ICD-V07.4) 16)  Osteopenia  (ICD-733.90) 17)  Hypothyroidism  (ICD-244.9) 18)  Hyperlipidemia  (ICD-272.4)  Current Medications (verified): 1)  Synthroid 50 Mcg  Tabs (Levothyroxine Sodium) .... Take 1 Tablet By Mouth Once A Day 2)  Aspirin 81 Mg  Tbec (Aspirin) .... Once Daily 3)  Calcium-Vitamin D 600-200 Mg-Unit  Tabs (Calcium-Vitamin D) .... Once Daily 4)  Sertraline Hcl 50 Mg Tabs (Sertraline Hcl) .... One By Mouth Daily 5)  Simvastatin 40 Mg Tabs (Simvastatin) .... One By Mouth Daily  Allergies (verified): 1)  ! Codeine  Sulfate (Codeine Sulfate)   Impression & Recommendations:  Problem # 1:  PLANTAR WART, LEFT (ICD-078.12) Assessment Deteriorated the lesion was identifies as a  pantar wart on the ball of the right foot    and 40 seconds of cryotherapy with the liguid nitrogen gun was apllied to the site. The pt tolerated the procedure and post procedure care was discussed  Complete Medication List: 1)  Synthroid 50 Mcg Tabs (Levothyroxine sodium) .... Take 1 tablet by mouth once a day 2)  Aspirin 81 Mg Tbec (Aspirin) .... Once daily 3)  Calcium-vitamin D 600-200 Mg-unit Tabs (Calcium-vitamin d) .... Once daily 4)  Sertraline Hcl 50 Mg Tabs (Sertraline hcl) .... One by mouth daily 5)  Simvastatin 40 Mg Tabs (Simvastatin) .... One by mouth daily  Other Orders: No Charge Patient Arrived (NCPA0) (NCPA0) Cryotherapy/Destruction benign or premalignant lesion (1st lesion)  ([address])

## 2010-11-09 NOTE — Letter (Signed)
Summary: [facility]   Imported By: Maryln Gottron 08/30/2010 14:36:26  _____________________________________________________________________  External Attachment:    Type:   Image     Comment:   External Document

## 2010-11-09 NOTE — Progress Notes (Signed)
Summary: UTI  Phone Note Call from Patient Call back at Home Phone [phone]   Caller: Patient Call For: Stacie Glaze MD Summary of Call: Pt is having urinary urgency with pressure.  hematuria and frequency.  No car today.  [facility] [place]  No fever, chills or back ache. Initial call taken by: Lynann Beaver CMA AAMA,  October 30, 2010 3:46 PM  Follow-up for Phone Call        may have cipro 500 two times a day for 5 days per dr Lovell Sheehan Follow-up by: Willy Eddy, LPN,  October 30, 2010 4:14 PM    New/Updated Medications: CIPRO 500 MG TABS (CIPROFLOXACIN HCL) one by mouth two times a day x 5 days Prescriptions: CIPRO 500 MG TABS (CIPROFLOXACIN HCL) one by mouth two times a day x 5 days  #10 x 0   Entered by:   Lynann Beaver CMA AAMA   Authorized by:   Stacie Glaze MD   Signed by:   Lynann Beaver CMA AAMA on 10/30/2010   Method used:   Electronically to        [facility] [address] [phone]* (retail)       [address]       [place], [place]  [address]       Ph: [phone]       Fax: [phone]   RxID:   [id number]  Notified pt.

## 2010-11-09 NOTE — Letter (Signed)
Summary: [facility]   Imported By: Georgian Co 09/07/2010 11:00:14  _____________________________________________________________________  External Attachment:    Type:   Image     Comment:   External Document

## 2010-11-13 ENCOUNTER — Telehealth: Payer: Self-pay | Admitting: *Deleted

## 2010-11-13 NOTE — Telephone Encounter (Signed)
Wednesday at 4:00 recommend that she try Mucinex fast max cough and cold over-the-counter until then

## 2010-11-13 NOTE — Telephone Encounter (Signed)
Pt felt better for a while after taking a Zpack and Tessalon, but is worse with the cough now.   Would like to try a codeine cough medicine or come to see Dr. Lovell Sheehan on Wed. [facility]

## 2010-11-13 NOTE — Telephone Encounter (Signed)
Appt scheduled Wednesday 11/15/10 @ 4pm per md pt notified, also advised to get mucinex max cough and cold per dr instructions.

## 2010-11-15 ENCOUNTER — Ambulatory Visit (INDEPENDENT_AMBULATORY_CARE_PROVIDER_SITE_OTHER): Payer: Medicare Other | Admitting: Internal Medicine

## 2010-11-15 ENCOUNTER — Encounter: Payer: Self-pay | Admitting: Internal Medicine

## 2010-11-15 VITALS — BP 124/80 | HR 76 | Temp 98.7°F | Resp 16 | Ht 64.0 in | Wt 186.0 lb

## 2010-11-15 DIAGNOSIS — R062 Wheezing: Secondary | ICD-10-CM

## 2010-11-15 DIAGNOSIS — J45909 Unspecified asthma, uncomplicated: Secondary | ICD-10-CM

## 2010-11-15 DIAGNOSIS — J45991 Cough variant asthma: Secondary | ICD-10-CM

## 2010-11-15 DIAGNOSIS — J069 Acute upper respiratory infection, unspecified: Secondary | ICD-10-CM

## 2010-11-15 MED ORDER — DOXYCYCLINE HYCLATE 100 MG PO TABS: 100.0000 mg | ORAL_TABLET | Freq: Two times a day (BID) | ORAL | Status: AC

## 2010-11-15 MED ORDER — PSEUDOEPH-CHLORPHEN-HYDROCOD 60-4-5 MG/5ML PO SOLN: 5.0000 mL | Freq: Two times a day (BID) | ORAL | Status: DC

## 2010-11-15 MED ORDER — METHYLPREDNISOLONE (PAK) 4 MG PO TABS: 4.0000 mg | ORAL_TABLET | Freq: Every day | ORAL | Status: DC

## 2010-11-15 NOTE — Progress Notes (Signed)
Subjective:    Patient ID: Jacqueline Mays, female    DOB: 1941-07-22, 70 y.o.   MRN: [id number]  Cough This is a new problem. The current episode started 1 to 4 weeks ago. The problem has been waxing and waning. The problem occurs every few minutes. The cough is non-productive. Associated symptoms include nasal congestion, postnasal drip, rhinorrhea and wheezing. Pertinent negatives include no chest pain, chills, ear congestion, ear pain, eye redness, headaches, myalgias, rash or shortness of breath. The symptoms are aggravated by nothing. She has tried OTC cough suppressant for the symptoms. The treatment provided mild relief. Her past medical history is significant for asthma.  Asthma She complains of cough and wheezing. There is no shortness of breath. This is a recurrent problem. The current episode started in the past 7 days. The problem occurs 2 to 4 times per day. The problem has been waxing and waning. The cough is non-productive, barking and paroxysmal. Associated symptoms include nasal congestion, postnasal drip and rhinorrhea. Pertinent negatives include no appetite change, chest pain, ear congestion, ear pain, headaches or myalgias. Her symptoms are aggravated by minimal activity. Her symptoms are alleviated by prescription cough suppressant. She reports moderate improvement on treatment. Her symptoms are not alleviated by OTC cough suppressant. Her past medical history is significant for asthma.  URI  This is a new problem. The current episode started in the past 7 days. The problem has been waxing and waning. There has been no fever. Associated symptoms include coughing, rhinorrhea and wheezing. Pertinent negatives include no abdominal pain, chest pain, congestion, dysuria, ear pain, headaches, neck pain or rash. She has tried decongestant, antihistamine and inhaler use for the symptoms. The treatment provided mild relief.      Review of Systems  Constitutional: Negative for chills,  activity change, appetite change and fatigue.  HENT: Positive for rhinorrhea, postnasal drip and sinus pressure. Negative for ear pain, congestion and neck pain.   Eyes: Negative for redness and visual disturbance.  Respiratory: Positive for cough and wheezing. Negative for shortness of breath.   Cardiovascular: Negative for chest pain.  Gastrointestinal: Negative for abdominal pain and abdominal distention.  Genitourinary: Negative for dysuria, frequency and menstrual problem.  Musculoskeletal: Negative for myalgias, joint swelling and arthralgias.  Skin: Negative for rash and wound.  Neurological: Negative for dizziness, weakness and headaches.  Hematological: Negative for adenopathy. Does not bruise/bleed easily.  Psychiatric/Behavioral: Negative for sleep disturbance and decreased concentration.       Objective:   Physical Exam  Constitutional: She is oriented to person, place, and time. She appears well-developed and well-nourished. No distress.  HENT:  Head: Normocephalic and atraumatic.  Right Ear: External ear normal.  Left Ear: External ear normal.  Nose: Nose normal.  Eyes: Conjunctivae and EOM are normal. Pupils are equal, round, and reactive to light.  Neck: Normal range of motion. Neck supple. No JVD present. No tracheal deviation present. No thyromegaly present.  Cardiovascular: Normal rate, regular rhythm, normal heart sounds and intact distal pulses.   No murmur heard. Pulmonary/Chest: Effort normal. She has wheezes. She exhibits tenderness.  Abdominal: Soft. Bowel sounds are normal.  Musculoskeletal: Normal range of motion. She exhibits no edema and no tenderness.  Lymphadenopathy:    She has no cervical adenopathy.  Neurological: She is alert and oriented to person, place, and time. She has normal reflexes. No cranial nerve deficit.  Skin: Skin is warm and dry. She is not diaphoretic.  Psychiatric: She has a normal mood and  affect. Her behavior is normal.            Assessment & Plan:  1. Chronic asthma the patient has a flare of her chronic asthma induced by her coughing episodes from a URI the URI may have been viral in etiology initially but since we are going to have to use prednisone to quiet the bronchospasm she'll be covered with doxycycline she is given samples of her rescue inhaler during most of the year she uses her rescue inhaler rarely and this is an acute flare asthma which only happens once or twice a year therefore no maintenance medications are indicated at this time  2. URI patient has an acute URI we'll treat with prednisone her albuterol inhaler and doxycycline she is also given samples of a cough medicine 2 use to suppress the cough.  Should her symptoms not improve over the next 24-48 hours we recommend a chest x-ray  3. 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

## 2010-11-15 NOTE — Progress Notes (Signed)
  Phone Note Call from Patient Call back at Home Phone [phone]   Caller: Patient Call For: Stacie Glaze MD Summary of Call: pt is complaining of coughing all night with headache x 3- 4 days.  No OTC meds have helped.  Wants Tessalon Pearles and Zpack. [facility]  Initial call taken by: Lynann Beaver CMA AAMA,  November 06, 2010 9:59 AM  Follow-up for Phone Call        per dr Lovell Sheehan- may have z pack and tessalon pearlres=#20 1 two times a day as needed cough Follow-up by: Willy Eddy, LPN,  November 06, 2010 11:08 AM    New/Updated Medications: ZITHROMAX Z-PAK 250 MG TABS (AZITHROMYCIN) as directed TESSALON 200 MG CAPS (BENZONATATE) one by mouth bid Prescriptions: TESSALON 200 MG CAPS (BENZONATATE) one by mouth bid  #20 x 0   Entered by:   Lynann Beaver CMA AAMA   Authorized by:   Stacie Glaze MD   Signed by:   Lynann Beaver CMA AAMA on 11/06/2010   Method used:   Electronically to        [facility] [address] # [phone]* (retail)       [address]       [place], [place]  [address]       Ph: [phone]       Fax: [phone]   RxID:   [id number] ZITHROMAX Z-PAK 250 MG TABS (AZITHROMYCIN) as directed  #6 x 0   Entered by:   Lynann Beaver CMA AAMA   Authorized by:   Stacie Glaze MD   Signed by:   Lynann Beaver CMA AAMA on 11/06/2010   Method used:   Electronically to        [facility] [address] # [phone]* (retail)       [address]       [place], [place]  [address]       Ph: [phone]       Fax: [phone]   RxID:   [id number]  Notified pt.

## 2010-12-07 ENCOUNTER — Other Ambulatory Visit: Payer: Self-pay | Admitting: Internal Medicine

## 2010-12-07 DIAGNOSIS — Z1231 Encounter for screening mammogram for malignant neoplasm of breast: Secondary | ICD-10-CM

## 2010-12-12 ENCOUNTER — Ambulatory Visit (HOSPITAL_COMMUNITY): Payer: Medicare Other

## 2010-12-22 ENCOUNTER — Ambulatory Visit (HOSPITAL_COMMUNITY)
Admission: RE | Admit: 2010-12-22 | Discharge: 2010-12-22 | Disposition: A | Discharge status: Home / Self Care | Payer: Medicare Other | Source: Ambulatory Visit | Attending: Internal Medicine | Admitting: Internal Medicine

## 2010-12-22 DIAGNOSIS — Z1231 Encounter for screening mammogram for malignant neoplasm of breast: Secondary | ICD-10-CM

## 2011-02-01 ENCOUNTER — Other Ambulatory Visit: Payer: Self-pay | Admitting: *Deleted

## 2011-02-01 MED ORDER — SIMVASTATIN 40 MG PO TABS: 40.0000 mg | ORAL_TABLET | Freq: Every day | ORAL | Status: DC

## 2011-03-08 ENCOUNTER — Ambulatory Visit (INDEPENDENT_AMBULATORY_CARE_PROVIDER_SITE_OTHER): Payer: Medicare Other | Admitting: Internal Medicine

## 2011-03-08 DIAGNOSIS — Z0000 Encounter for general adult medical examination without abnormal findings: Secondary | ICD-10-CM

## 2011-03-08 DIAGNOSIS — Z2911 Encounter for prophylactic immunotherapy for respiratory syncytial virus (RSV): Secondary | ICD-10-CM

## 2011-03-28 ENCOUNTER — Ambulatory Visit (INDEPENDENT_AMBULATORY_CARE_PROVIDER_SITE_OTHER): Payer: Medicare Other | Admitting: Internal Medicine

## 2011-03-28 ENCOUNTER — Encounter: Payer: Self-pay | Admitting: Internal Medicine

## 2011-03-28 VITALS — BP 124/76 | HR 72 | Temp 98.2°F | Resp 16 | Ht 64.0 in | Wt 186.0 lb

## 2011-03-28 DIAGNOSIS — R5383 Other fatigue: Secondary | ICD-10-CM

## 2011-03-28 DIAGNOSIS — F411 Generalized anxiety disorder: Secondary | ICD-10-CM

## 2011-03-28 DIAGNOSIS — E039 Hypothyroidism, unspecified: Secondary | ICD-10-CM

## 2011-03-28 DIAGNOSIS — E785 Hyperlipidemia, unspecified: Secondary | ICD-10-CM

## 2011-03-28 DIAGNOSIS — R030 Elevated blood-pressure reading, without diagnosis of hypertension: Secondary | ICD-10-CM

## 2011-03-28 DIAGNOSIS — R5381 Other malaise: Secondary | ICD-10-CM

## 2011-03-28 LAB — TSH: TSH: 1.05 u[IU]/mL (ref 0.35–5.50)

## 2011-03-28 LAB — CBC WITH DIFFERENTIAL/PLATELET
Basophils Relative: 0.4 % (ref 0.0–3.0)
Eosinophils Absolute: 0.2 10*3/uL (ref 0.0–0.7)
Eosinophils Relative: 1.9 % (ref 0.0–5.0)
HCT: 41.2 % (ref 36.0–46.0)
Lymphs Abs: 2.5 10*3/uL (ref 0.7–4.0)
MCHC: 32.8 g/dL (ref 30.0–36.0)
Neutro Abs: 4.7 10*3/uL (ref 1.4–7.7)
Neutrophils Relative %: 59.4 % (ref 43.0–77.0)
Platelets: 218 10*3/uL (ref 150.0–400.0)
WBC: 7.9 10*3/uL (ref 4.5–10.5)

## 2011-03-28 LAB — T3, FREE: T3, Free: 2.8 pg/mL (ref 2.3–4.2)

## 2011-03-28 MED ORDER — SERTRALINE HCL 50 MG PO TABS: 50.0000 mg | ORAL_TABLET | Freq: Every day | ORAL | Status: DC

## 2011-03-28 NOTE — Assessment & Plan Note (Signed)
Increased fatigue TSH stable in Nov 11

## 2011-03-28 NOTE — Progress Notes (Signed)
  Subjective:    Patient ID: Jacqueline Mays, female    DOB: 01-15-1941, 70 y.o.   MRN: [id number]  HPI  Followed up for hypothyroidism, hypertesion Hx of lumbar back pain but no acute symptoms Increased anxiety, fatigue and insomnia No hx of anemia Hx of depression on zoloft but off this for several months  Review of Systems  Constitutional: Negative for activity change, appetite change and fatigue.  HENT: Negative for ear pain, congestion, neck pain, postnasal drip and sinus pressure.   Eyes: Negative for redness and visual disturbance.  Respiratory: Negative for cough, shortness of breath and wheezing.   Gastrointestinal: Negative for abdominal pain and abdominal distention.  Genitourinary: Negative for dysuria, frequency and menstrual problem.  Musculoskeletal: Negative for myalgias, joint swelling and arthralgias.  Skin: Negative for rash and wound.  Neurological: Negative for dizziness, weakness and headaches.  Hematological: Negative for adenopathy. Does not bruise/bleed easily.  Psychiatric/Behavioral: Negative for sleep disturbance and decreased concentration.   Past Medical History  Diagnosis Date  . Hyperlipidemia   . Thyroid disease     Hypothyroid  . Osteopenia   . Menopause   . Postmenopausal HRT (hormone replacement therapy)    Past Surgical History  Procedure Date  . Abdominal hysterectomy     TAH  . Spine surgery     LUMBAR LAMINECTOMY    reports that she has never smoked. She does not have any smokeless tobacco history on file. She reports that she drinks about 2.4 ounces of alcohol per week. She reports that she does not use illicit drugs. family history is not on file. Allergies  Allergen Reactions  . Codeine Sulfate     REACTION: unspecified       Objective:   Physical Exam  Constitutional: She is oriented to person, place, and time. She appears well-developed and well-nourished. No distress.  HENT:  Head: Normocephalic and atraumatic.    Right Ear: External ear normal.  Left Ear: External ear normal.  Nose: Nose normal.  Mouth/Throat: Oropharynx is clear and moist.  Eyes: Conjunctivae and EOM are normal. Pupils are equal, round, and reactive to light.  Neck: Normal range of motion. Neck supple. No JVD present. No tracheal deviation present. No thyromegaly present.  Cardiovascular: Normal rate, regular rhythm, normal heart sounds and intact distal pulses.   No murmur heard. Pulmonary/Chest: Effort normal and breath sounds normal. She has no wheezes. She exhibits no tenderness.  Abdominal: Soft. Bowel sounds are normal.  Musculoskeletal: Normal range of motion. She exhibits no edema and no tenderness.  Lymphadenopathy:    She has no cervical adenopathy.  Neurological: She is alert and oriented to person, place, and time. She has normal reflexes. No cranial nerve deficit.  Skin: Skin is warm and dry. She is not diaphoretic.  Psychiatric: She has a normal mood and affect. Her behavior is normal.          Assessment & Plan:  Increased fatigue: Vision presents with worsening fatigue certainly anemia and hypothyroidism complaining of pole and this screening for thyroid with a T3 free T4 free as well as a TSH I will also screen for anemia with a CBC differential.  I suspect that her history of mild to moderate anxiety plays a role with increasing stresses in her life and living situation and recommended that she resume the Zoloft and stay on the 50 mg dose for 4 weeks and if the symptoms are not abating to increase that to twice daily.

## 2011-03-28 NOTE — Patient Instructions (Signed)
Stay on the 50 mg of Zoloft for 4 weeks total from start to and if the symptoms have not lessened significantly increase that to one twice a day

## 2011-04-04 ENCOUNTER — Telehealth: Payer: Self-pay | Admitting: *Deleted

## 2011-04-04 NOTE — Telephone Encounter (Signed)
Pt would like lab results.  

## 2011-04-04 NOTE — Telephone Encounter (Signed)
Pt informed wnl 

## 2011-12-24 ENCOUNTER — Ambulatory Visit (INDEPENDENT_AMBULATORY_CARE_PROVIDER_SITE_OTHER): Payer: Medicare Other | Admitting: Internal Medicine

## 2011-12-24 ENCOUNTER — Encounter: Payer: Self-pay | Admitting: Internal Medicine

## 2011-12-24 VITALS — BP 130/80 | HR 72 | Temp 98.2°F | Resp 16 | Ht 66.0 in | Wt 187.0 lb

## 2011-12-24 DIAGNOSIS — E559 Vitamin D deficiency, unspecified: Secondary | ICD-10-CM

## 2011-12-24 DIAGNOSIS — I10 Essential (primary) hypertension: Secondary | ICD-10-CM

## 2011-12-24 DIAGNOSIS — T887XXA Unspecified adverse effect of drug or medicament, initial encounter: Secondary | ICD-10-CM

## 2011-12-24 DIAGNOSIS — E785 Hyperlipidemia, unspecified: Secondary | ICD-10-CM

## 2011-12-24 DIAGNOSIS — E039 Hypothyroidism, unspecified: Secondary | ICD-10-CM

## 2011-12-24 DIAGNOSIS — Z0000 Encounter for general adult medical examination without abnormal findings: Secondary | ICD-10-CM

## 2011-12-24 LAB — CBC WITH DIFFERENTIAL/PLATELET
Basophils Relative: 0.5 % (ref 0.0–3.0)
Eosinophils Relative: 1.4 % (ref 0.0–5.0)
Hemoglobin: 14 g/dL (ref 12.0–15.0)
Lymphocytes Relative: 35 % (ref 12.0–46.0)
Lymphs Abs: 3.2 10*3/uL (ref 0.7–4.0)
Monocytes Relative: 6.9 % (ref 3.0–12.0)
Platelets: 219 10*3/uL (ref 150.0–400.0)
RBC: 5.34 Mil/uL — ABNORMAL HIGH (ref 3.87–5.11)
RDW: 13.3 % (ref 11.5–14.6)
WBC: 9.1 10*3/uL (ref 4.5–10.5)

## 2011-12-24 LAB — TSH: TSH: 2.2 u[IU]/mL (ref 0.35–5.50)

## 2011-12-24 LAB — HEPATIC FUNCTION PANEL
ALT: 21 U/L (ref 0–35)
AST: 19 U/L (ref 0–37)
Albumin: 4.3 g/dL (ref 3.5–5.2)
Bilirubin, Direct: 0.1 mg/dL (ref 0.0–0.3)
Total Bilirubin: 0.1 mg/dL — ABNORMAL LOW (ref 0.3–1.2)
Total Protein: 7.5 g/dL (ref 6.0–8.3)

## 2011-12-24 LAB — BASIC METABOLIC PANEL
CO2: 33 mEq/L — ABNORMAL HIGH (ref 19–32)
Potassium: 3.6 mEq/L (ref 3.5–5.1)
Sodium: 143 mEq/L (ref 135–145)

## 2011-12-24 LAB — LIPID PANEL: HDL: 57.1 mg/dL (ref >39.00)

## 2011-12-24 NOTE — Patient Instructions (Signed)
The patient is instructed to continue all medications as prescribed. Schedule followup with check out clerk upon leaving the clinic  

## 2011-12-24 NOTE — Progress Notes (Signed)
Subjective:    Patient ID: Jacqueline Mays, female    DOB: [birth year], 71 y.o.   MRN: [id number]  HPI  Followed for hypothyroidism. Elevated lipids and obesity She has a hx of elevated blood pressure without Htn.   Review of Systems  Constitutional: Negative for activity change, appetite change and fatigue.  HENT: Negative for ear pain, congestion, neck pain, postnasal drip and sinus pressure.   Eyes: Negative for redness and visual disturbance.  Respiratory: Negative for cough, shortness of breath and wheezing.   Gastrointestinal: Negative for abdominal pain and abdominal distention.  Genitourinary: Negative for dysuria, frequency and menstrual problem.  Musculoskeletal: Negative for myalgias, joint swelling and arthralgias.  Skin: Negative for rash and wound.  Neurological: Negative for dizziness, weakness and headaches.  Hematological: Negative for adenopathy. Does not bruise/bleed easily.  Psychiatric/Behavioral: Negative for sleep disturbance and decreased concentration.   Past Medical History  Diagnosis Date  . Hyperlipidemia   . Thyroid disease     Hypothyroid  . Osteopenia   . Menopause   . Postmenopausal HRT (hormone replacement therapy)     History   Social History  . Marital Status: Married    Spouse Name: N/A    Number of Children: N/A  . Years of Education: N/A   Occupational History  . Not on file.   Social History Main Topics  . Smoking status: Never Smoker   . Smokeless tobacco: Not on file  . Alcohol Use: 2.4 oz/week    4 Glasses of wine per week  . Drug Use: No  . Sexually Active: Yes    Birth Control/ Protection: Post-menopausal   Other Topics Concern  . Not on file   Social History Narrative  . No narrative on file    Past Surgical History  Procedure Date  . Abdominal hysterectomy     TAH  . Spine surgery     LUMBAR LAMINECTOMY    Family History  Problem Relation Age of Onset  . Heart disease Mother   . Heart disease Father      Allergies  Allergen Reactions  . Codeine Sulfate     REACTION: unspecified    Current Outpatient Prescriptions on File Prior to Visit  Medication Sig Dispense Refill  . aspirin 81 MG tablet Take 81 mg by mouth daily.        . Cholecalciferol 1000 UNITS tablet Take 1,000 Units by mouth 2 (two) times daily.        Marland Kitchen levothyroxine (SYNTHROID, LEVOTHROID) 50 MCG tablet Take 50 mcg by mouth daily.        . sertraline (ZOLOFT) 50 MG tablet Take 1 tablet (50 mg total) by mouth daily.  90 tablet  3  . simvastatin (ZOCOR) 40 MG tablet Take 1 tablet (40 mg total) by mouth at bedtime.  90 tablet  3    BP 130/80  Pulse 72  Temp 98.2 F (36.8 C)  Resp 16  Ht 5\' 6"  (1.676 m)  Wt 187 lb (84.823 kg)  BMI 30.18 kg/m2       Objective:   Physical Exam  Nursing note and vitals reviewed. Constitutional: She is oriented to person, place, and time. She appears well-developed and well-nourished. No distress.  HENT:  Head: Normocephalic and atraumatic.  Right Ear: External ear normal.  Left Ear: External ear normal.  Nose: Nose normal.  Mouth/Throat: Oropharynx is clear and moist.  Eyes: Conjunctivae and EOM are normal. Pupils are equal, round, and reactive to light.  Neck: Normal range of motion. Neck supple. No JVD present. No tracheal deviation present. No thyromegaly present.  Cardiovascular: Normal rate, regular rhythm, normal heart sounds and intact distal pulses.   No murmur heard. Pulmonary/Chest: Effort normal and breath sounds normal. She has no wheezes. She exhibits no tenderness.  Abdominal: Soft. Bowel sounds are normal.  Musculoskeletal: Normal range of motion. She exhibits no edema and no tenderness.  Lymphadenopathy:    She has no cervical adenopathy.  Neurological: She is alert and oriented to person, place, and time. She has normal reflexes. No cranial nerve deficit.  Skin: Skin is warm and dry. She is not diaphoretic.  Psychiatric: She has a normal mood and affect.  Her behavior is normal.          Assessment & Plan:  Appropriate blood work was ordered today including a thyroid panel T3-T4 and TSH a lipid panel and liver panel.  She has a history of elevated blood pressure without the diagnosis of hypertension we will get a basic metabolic panel she has a history of osteopenia and we will do a vitamin D level.  Appropriate CBC with differential and urinalysis will be done to monitor effect of drugs. Subjective:    Jacqueline Mays is a 71 y.o. female who presents for Medicare Annual/Subsequent preventive examination.  Preventive Screening-Counseling & Management  Tobacco History  Smoking status  . Never Smoker   Smokeless tobacco  . Not on file     Problems Prior to Visit 1.   Current Problems (verified) Patient Active Problem List  Diagnoses  . PLANTAR WART, LEFT  . MELANOMA, LENTIGO MALIGNA  . CARCINOMA, BASAL CELL  . LIPOMA  . HYPOTHYROIDISM  . HYPERLIPIDEMIA  . ACTINIC KERATOSIS  . LUMBAR RADICULOPATHY, RIGHT  . OSTEOPENIA  . INSOMNIA  . WEIGHT GAIN, ABNORMAL  . ELEVATED BLOOD PRESSURE WITHOUT DIAGNOSIS OF HYPERTENSION  . UNS ADVRS EFF UNS RX MEDICINAL&BIOLOGICAL SBSTNC    Medications Prior to Visit Current Outpatient Prescriptions on File Prior to Visit  Medication Sig Dispense Refill  . aspirin 81 MG tablet Take 81 mg by mouth daily.        . Cholecalciferol 1000 UNITS tablet Take 1,000 Units by mouth 2 (two) times daily.        Marland Kitchen levothyroxine (SYNTHROID, LEVOTHROID) 50 MCG tablet Take 50 mcg by mouth daily.        . sertraline (ZOLOFT) 50 MG tablet Take 1 tablet (50 mg total) by mouth daily.  90 tablet  3  . simvastatin (ZOCOR) 40 MG tablet Take 1 tablet (40 mg total) by mouth at bedtime.  90 tablet  3    Current Medications (verified) Current Outpatient Prescriptions  Medication Sig Dispense Refill  . aspirin 81 MG tablet Take 81 mg by mouth daily.        . Cholecalciferol 1000 UNITS tablet Take 1,000  Units by mouth 2 (two) times daily.        Marland Kitchen levothyroxine (SYNTHROID, LEVOTHROID) 50 MCG tablet Take 50 mcg by mouth daily.        . sertraline (ZOLOFT) 50 MG tablet Take 1 tablet (50 mg total) by mouth daily.  90 tablet  3  . simvastatin (ZOCOR) 40 MG tablet Take 1 tablet (40 mg total) by mouth at bedtime.  90 tablet  3     Allergies (verified) Codeine sulfate   PAST HISTORY  Family History Family History  Problem Relation Age of Onset  . Heart disease Mother   .  Heart disease Father     Social History History  Substance Use Topics  . Smoking status: Never Smoker   . Smokeless tobacco: Not on file  . Alcohol Use: 2.4 oz/week    4 Glasses of wine per week     Are there smokers in your home (other than you)? No  Risk Factors Current exercise habits: The patient does not participate in regular exercise at present.  Dietary issues discussed: reviewed weight   Cardiac risk factors: advanced age (older than 80 for men, 72 for women), dyslipidemia and hypertension.  Depression Screen (Note: if answer to either of the following is "Yes", a more complete depression screening is indicated)   Over the past two weeks, have you felt down, depressed or hopeless? No  Over the past two weeks, have you felt little interest or pleasure in doing things? No  Have you lost interest or pleasure in daily life? No  Do you often feel hopeless? No  Do you cry easily over simple problems? No  Activities of Daily Living In your present state of health, do you have any difficulty performing the following activities?:  Driving? No Managing money?  No Feeding yourself? No Getting from bed to chair? No Climbing a flight of stairs? No Preparing food and eating?: No Bathing or showering? No Getting dressed: No Getting to the toilet? No Using the toilet:No Moving around from place to place: No In the past year have you fallen or had a near fall?:No   Are you sexually active?  Yes  Do you  have more than one partner?  No  Hearing Difficulties: No Do you often ask people to speak up or repeat themselves? No Do you experience ringing or noises in your ears? No Do you have difficulty understanding soft or whispered voices? No   Do you feel that you have a problem with memory? No  Do you often misplace items? No  Do you feel safe at home?  No  Cognitive Testing  Alert? No  Normal Appearance?Yes  Oriented to person? Yes  Place? Yes   Time? Yes  Recall of three objects?  Yes  Can perform simple calculations? Yes  Displays appropriate judgment?Yes  Can read the correct time from a watch face?Yes   Advanced Directives have been discussed with the patient? Yes  List the Names of Other Physician/Practitioners you currently use: 1.    Indicate any recent Medical Services you may have received from other than [facility] providers in the past year (date may be approximate).  Immunization History  Administered Date(s) Administered  . Influenza Whole 08/05/2007, 08/10/2009, 08/09/2010, 05/26/2011  . Pneumococcal Polysaccharide 10/08/2005  . Td 06/08/1998, 08/10/2008  . Zoster 03/08/2011    Screening Tests Health Maintenance  Topic Date Due  . Pneumococcal Polysaccharide Vaccine Age 65 And Over  02/02/2006  . Influenza Vaccine  12/24/2011  . Tetanus/tdap  08/10/2018  . Colonoscopy  10/11/2018  . Zostavax  Completed    All answers were reviewed with the patient and necessary referrals were made:  Carrie Mew, MD   12/24/2011   History reviewed: allergies, current medications, past family history, past medical history, past social history, past surgical history and problem list  Review of Systems A comprehensive review of systems was negative.    Objective:     Vision by Snellen chart: right eye:20/20, left eye:20/20  Body mass index is 30.18 kg/(m^2). BP 130/80  Pulse 72  Temp 98.2 F (36.8 C)  Resp 16  Ht 5\' 6"  (1.676 m)  Wt 187 lb (84.823 kg)  BMI 30.18  kg/m2  BP 130/80  Pulse 72  Temp 98.2 F (36.8 C)  Resp 16  Ht 5\' 6"  (1.676 m)  Wt 187 lb (84.823 kg)  BMI 30.18 kg/m2  General Appearance:    Alert, cooperative, no distress, appears stated age  Head:    Normocephalic, without obvious abnormality, atraumatic  Eyes:    PERRL, conjunctiva/corneas clear, EOM's intact, fundi    benign, both eyes  Ears:    Normal TM's and external ear canals, both ears  Nose:   Nares normal, septum midline, mucosa normal, no drainage    or sinus tenderness  Throat:   Lips, mucosa, and tongue normal; teeth and gums normal  Neck:   Supple, symmetrical, trachea midline, no adenopathy;    thyroid:  no enlargement/tenderness/nodules; no carotid   bruit or JVD  Back:     Symmetric, no curvature, ROM normal, no CVA tenderness  Lungs:     Clear to auscultation bilaterally, respirations unlabored  Chest Wall:    No tenderness or deformity   Heart:    Regular rate and rhythm, S1 and S2 normal, no murmur, rub   or gallop  Breast Exam:    No tenderness, masses, or nipple abnormality  Abdomen:     Soft, non-tender, bowel sounds active all four quadrants,    no masses, no organomegaly  Genitalia:    Normal female without lesion, discharge or tenderness  Rectal:    Normal tone, normal prostate, no masses or tenderness;   guaiac negative stool  Extremities:   Extremities normal, atraumatic, no cyanosis or edema  Pulses:   2+ and symmetric all extremities  Skin:   Skin color, texture, turgor normal, no rashes or lesions  Lymph nodes:   Cervical, supraclavicular, and axillary nodes normal  Neurologic:   CNII-XII intact, normal strength, sensation and reflexes    throughout       Assessment:     This is a routine physical examination for this healthy  Female. Reviewed all health maintenance protocols including mammography colonoscopy bone density and reviewed appropriate screening labs. Her immunization history was reviewed as well as her current medications and  allergies refills of her chronic medications were given and the plan for yearly health maintenance was discussed all orders and referrals were made as appropriate.      Plan:     During the course of the visit the patient was educated and counseled about appropriate screening and preventive services including:    Influenza vaccine  Td vaccine  Colorectal cancer screening  Diet review for nutrition referral? Yes ____  Not Indicated ____   Patient Instructions (the written plan) was given to the patient.  Medicare Attestation I have personally reviewed: The patient's medical and social history Their use of alcohol, tobacco or illicit drugs Their current medications and supplements The patient's functional ability including ADLs,fall risks, home safety risks, cognitive, and hearing and visual impairment Diet and physical activities Evidence for depression or mood disorders  The patient's weight, height, BMI, and visual acuity have been recorded in the chart.  I have made referrals, counseling, and provided education to the patient based on review of the above and I have provided the patient with a written personalized care plan for preventive services.     Carrie Mew, MD   12/24/2011

## 2012-01-18 ENCOUNTER — Other Ambulatory Visit: Payer: Self-pay | Admitting: *Deleted

## 2012-01-18 MED ORDER — LEVOTHYROXINE SODIUM 50 MCG PO TABS: 50.0000 ug | ORAL_TABLET | Freq: Every day | ORAL | Status: DC

## 2012-01-18 MED ORDER — SIMVASTATIN 40 MG PO TABS: 40.0000 mg | ORAL_TABLET | Freq: Every day | ORAL | Status: DC

## 2012-01-22 ENCOUNTER — Telehealth: Payer: Self-pay | Admitting: Internal Medicine

## 2012-01-22 MED ORDER — FLUTICASONE PROPIONATE 50 MCG/ACT NA SUSP: 2.0000 | Freq: Every day | NASAL | Status: DC

## 2012-01-22 NOTE — Telephone Encounter (Signed)
Pt informed

## 2012-01-22 NOTE — Telephone Encounter (Signed)
Patient's spouse called stating that he would like a new rx for flonase called into [facility]. For his wife's allergies. Please advise.

## 2012-04-07 ENCOUNTER — Other Ambulatory Visit: Payer: Self-pay | Admitting: Internal Medicine

## 2012-06-02 ENCOUNTER — Ambulatory Visit (INDEPENDENT_AMBULATORY_CARE_PROVIDER_SITE_OTHER): Payer: Medicare Other | Admitting: Family Medicine

## 2012-06-02 ENCOUNTER — Encounter: Payer: Self-pay | Admitting: Family Medicine

## 2012-06-02 VITALS — BP 152/78 | Temp 98.2°F | Wt 189.0 lb

## 2012-06-02 DIAGNOSIS — M5416 Radiculopathy, lumbar region: Secondary | ICD-10-CM

## 2012-06-02 DIAGNOSIS — IMO0002 Reserved for concepts with insufficient information to code with codable children: Secondary | ICD-10-CM

## 2012-06-02 MED ORDER — PREDNISONE 10 MG PO TABS: ORAL_TABLET | ORAL | Status: DC

## 2012-06-02 MED ORDER — HYDROCODONE-ACETAMINOPHEN 5-325 MG PO TABS: ORAL_TABLET | ORAL | Status: DC

## 2012-06-02 NOTE — Progress Notes (Addendum)
  Subjective:    Patient ID: Jacqueline Mays, female    DOB: 11/26/1940, 71 y.o.   MRN: [id number]  HPI  Acute visit. Onset 5 days ago fairly severe low back pain radiation left buttock and left lower extremity. Achy quality. Worse at night. Walking without much difficulty but does have some weakness left thigh and numbness left lateral thigh. No urinary incontinence. Patient's had prior history of L5-S1 discectomy 2009. She's tried Naprosyn and Tylenol without much relief. Took Vicoprofen which helps slightly.  Past Medical History  Diagnosis Date  . Hyperlipidemia   . Thyroid disease     Hypothyroid  . Osteopenia   . Menopause   . Postmenopausal HRT (hormone replacement therapy)    Past Surgical History  Procedure Date  . Abdominal hysterectomy     TAH  . Spine surgery     LUMBAR LAMINECTOMY    reports that she has never smoked. She does not have any smokeless tobacco history on file. She reports that she drinks about 2.4 ounces of alcohol per week. She reports that she does not use illicit drugs. family history includes Heart disease in her father and mother. Allergies  Allergen Reactions  . Codeine Sulfate     REACTION: unspecified      Review of Systems  Constitutional: Negative for fever, chills, appetite change and unexpected weight change.  Gastrointestinal: Negative for abdominal pain.  Genitourinary: Negative for dysuria.  Neurological: Positive for weakness and numbness.       Objective:   Physical Exam  Constitutional: She appears well-developed and well-nourished.  Cardiovascular: Normal rate and regular rhythm.   Pulmonary/Chest: Effort normal and breath sounds normal. No respiratory distress. She has no wheezes. She has no rales.  Musculoskeletal: She exhibits no edema.       Straight leg raises are negative.  Neurological:       Diminished left knee reflex compared to right. She also slightly diminished left ankle reflex compared to right. She has  weakness with left knee extension and minimal weakness with left dorsiflexion plantarflexion          Assessment & Plan:  Left lumbar radiculopathy. Question L3-L4 left nerve root involvement. She has history of severe spinal stenosis by previous MRI scan several years ago. She's had some rapidly progressive weakness and numbness with neurologic findings as above. Schedule MRI with and without contrast to further assess. Prednisone taper starting 40 mg daily. Vicodin as needed for nighttime pain  Severe left L4 nerve root compression.  Set up neurosurgical referral.

## 2012-06-04 ENCOUNTER — Ambulatory Visit
Admission: RE | Admit: 2012-06-04 | Discharge: 2012-06-04 | Disposition: A | Discharge status: Home / Self Care | Payer: Medicare Other | Source: Ambulatory Visit | Attending: Family Medicine | Admitting: Family Medicine

## 2012-06-04 DIAGNOSIS — M5416 Radiculopathy, lumbar region: Secondary | ICD-10-CM

## 2012-06-04 MED ORDER — GADOBENATE DIMEGLUMINE 529 MG/ML IV SOLN
17.0000 mL | Freq: Once | INTRAVENOUS | Status: AC | PRN
  Administered 2012-06-04: 17 mL via INTRAVENOUS

## 2012-06-05 NOTE — Progress Notes (Signed)
Quick Note:  Pt informed and she agrees with referral to neurosurgeon. She is requesting Dr Gerlene Fee as she has seen him before several times. Pt also requesting Dr Lovell Sheehan see and review MRI Lumbar Spine report. Pt has a 6 month follow-up with Dr Lovell Sheehan on 9/18. ______

## 2012-06-05 NOTE — Addendum Note (Signed)
Addended by: Kristian Covey on: 06/05/2012 08:06 AM   Modules accepted: Orders

## 2012-06-25 ENCOUNTER — Encounter: Payer: Self-pay | Admitting: Internal Medicine

## 2012-06-25 ENCOUNTER — Ambulatory Visit (INDEPENDENT_AMBULATORY_CARE_PROVIDER_SITE_OTHER): Payer: Medicare Other | Admitting: Internal Medicine

## 2012-06-25 VITALS — BP 136/82 | HR 72 | Temp 98.2°F | Resp 16 | Ht 66.0 in | Wt 190.0 lb

## 2012-06-25 DIAGNOSIS — E039 Hypothyroidism, unspecified: Secondary | ICD-10-CM

## 2012-06-25 DIAGNOSIS — Z85820 Personal history of malignant melanoma of skin: Secondary | ICD-10-CM | POA: Insufficient documentation

## 2012-06-25 DIAGNOSIS — IMO0002 Reserved for concepts with insufficient information to code with codable children: Secondary | ICD-10-CM

## 2012-06-25 DIAGNOSIS — E785 Hyperlipidemia, unspecified: Secondary | ICD-10-CM

## 2012-06-25 NOTE — Patient Instructions (Signed)
The patient is instructed to continue all medications as prescribed. Schedule followup with check out clerk upon leaving the clinic  

## 2012-06-25 NOTE — Progress Notes (Signed)
Subjective:    Patient ID: Jacqueline Mays, female    DOB: October 13, 1940, 71 y.o.   MRN: [id number]  HPI Asystole 71-year-old female who presents for followup of acute back pain.  She was treated by my partner with a prednisone burst and taper an MRI was obtained the MRI showed severe spinal stenosis and neurocompression of the L4 L5 nerve roots on the right in this patient presentation is consistent with the pain that she has experienced.  She is on prednisone burst and taper, she had a excellent response to prednisone and was out of pain very quickly.  She has been seen by the neurosurgeon who agrees that this is something that we need to watch that there is no evident surgical intervention that is necessary.  However the MRI had some abnormal enhancement of the L2 facet that is not part of this pain picture but could be significant in light of the patient's history of melanoma there is no bone destruction at this time so there is no warranted intervention but I believe that a six-month followup MRI should be performed to make sure that this was inflammatory arthritis changes and not a more ominous change.   Review of Systems  Constitutional: Negative for activity change, appetite change and fatigue.  HENT: Negative for ear pain, congestion, neck pain, postnasal drip and sinus pressure.   Eyes: Negative for redness and visual disturbance.  Respiratory: Negative for cough, shortness of breath and wheezing.   Gastrointestinal: Negative for abdominal pain and abdominal distention.  Genitourinary: Negative for dysuria, frequency and menstrual problem.  Musculoskeletal: Negative for myalgias, joint swelling and arthralgias.  Skin: Negative for rash and wound.  Neurological: Negative for dizziness, weakness and headaches.  Hematological: Negative for adenopathy. Does not bruise/bleed easily.  Psychiatric/Behavioral: Negative for disturbed wake/sleep cycle and decreased concentration.   Past  Medical History  Diagnosis Date  . Hyperlipidemia   . Thyroid disease     Hypothyroid  . Osteopenia   . Menopause   . Postmenopausal HRT (hormone replacement therapy)     History   Social History  . Marital Status: Married    Spouse Name: N/A    Number of Children: N/A  . Years of Education: N/A   Occupational History  . Not on file.   Social History Main Topics  . Smoking status: Never Smoker   . Smokeless tobacco: Not on file  . Alcohol Use: 2.4 oz/week    4 Glasses of wine per week  . Drug Use: No  . Sexually Active: Yes    Birth Control/ Protection: Post-menopausal   Other Topics Concern  . Not on file   Social History Narrative  . No narrative on file    Past Surgical History  Procedure Date  . Abdominal hysterectomy     TAH  . Spine surgery     LUMBAR LAMINECTOMY    Family History  Problem Relation Age of Onset  . Heart disease Mother   . Heart disease Father     Allergies  Allergen Reactions  . Codeine Sulfate     REACTION: unspecified    Current Outpatient Prescriptions on File Prior to Visit  Medication Sig Dispense Refill  . aspirin 81 MG tablet Take 81 mg by mouth daily.        . Cholecalciferol 1000 UNITS tablet Take 1,000 Units by mouth 2 (two) times daily.        . fluticasone (FLONASE) 50 MCG/ACT nasal spray Place 2 sprays  into the nose daily.  16 g  11  . levothyroxine (SYNTHROID, LEVOTHROID) 50 MCG tablet Take 1 tablet (50 mcg total) by mouth daily.  90 tablet  3  . sertraline (ZOLOFT) 50 MG tablet TAKE 1 TABLET DAILY  90 tablet  PRN  . simvastatin (ZOCOR) 40 MG tablet Take 1 tablet (40 mg total) by mouth at bedtime.  90 tablet  3    BP 136/82  Pulse 72  Temp 98.2 F (36.8 C)  Resp 16  Ht 5\' 6"  (1.676 m)  Wt 190 lb (86.183 kg)  BMI 30.67 kg/m2       Objective:   Physical Exam  Nursing note and vitals reviewed. Constitutional: She is oriented to person, place, and time. She appears well-developed and well-nourished. No  distress.  HENT:  Head: Normocephalic and atraumatic.  Right Ear: External ear normal.  Left Ear: External ear normal.  Nose: Nose normal.  Mouth/Throat: Oropharynx is clear and moist.  Eyes: Conjunctivae normal and EOM are normal. Pupils are equal, round, and reactive to light.  Neck: Normal range of motion. Neck supple. No JVD present. No tracheal deviation present. No thyromegaly present.  Cardiovascular: Normal rate, regular rhythm, normal heart sounds and intact distal pulses.   No murmur heard. Pulmonary/Chest: Effort normal and breath sounds normal. She has no wheezes. She exhibits no tenderness.  Abdominal: Soft. Bowel sounds are normal.  Musculoskeletal: Normal range of motion. She exhibits no edema and no tenderness.  Lymphadenopathy:    She has no cervical adenopathy.  Neurological: She is alert and oriented to person, place, and time. She has normal reflexes. No cranial nerve deficit.  Skin: Skin is warm and dry. She is not diaphoretic.  Psychiatric: She has a normal mood and affect. Her behavior is normal.          Assessment & Plan:  Repeat  MRI in March See HPI Significant improvement in back pain with the steroids Monitoring for chronic problems monitoring for melanoma discussed CPX in march or april

## 2012-07-01 ENCOUNTER — Telehealth: Payer: Self-pay | Admitting: *Deleted

## 2012-07-01 ENCOUNTER — Encounter: Payer: Self-pay | Admitting: Internal Medicine

## 2012-07-02 NOTE — Telephone Encounter (Signed)
Pt informe

## 2012-07-02 NOTE — Telephone Encounter (Signed)
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  Dr Wandra Feinstein referred her to [facility] to Dr Donnie Coffin oncologist.  Appt is Oct 8 for consult and then appt for surgical removal.  In view of the size,do you feel this appt is timely?  Or should she try to get an earlier appt?  SHe is worried about the time and growth of the melanoma

## 2012-07-28 ENCOUNTER — Encounter: Payer: Self-pay | Admitting: Internal Medicine

## 2012-07-29 ENCOUNTER — Other Ambulatory Visit: Payer: Self-pay | Admitting: *Deleted

## 2012-07-29 MED ORDER — LISINOPRIL 10 MG PO TABS: 10.0000 mg | ORAL_TABLET | Freq: Every day | ORAL | Status: DC

## 2012-07-29 NOTE — Telephone Encounter (Signed)
Left message on machine Sorry we didn't get back with pt early enough to order cream--

## 2012-07-29 NOTE — Telephone Encounter (Signed)
Left message on machine--dr jenkins wants to try her on prinivil 10 mg 1qd--willi send to [facility] that they use-

## 2012-08-11 ENCOUNTER — Encounter: Payer: Self-pay | Admitting: Family

## 2012-08-11 ENCOUNTER — Telehealth: Payer: Self-pay | Admitting: Family

## 2012-08-11 ENCOUNTER — Ambulatory Visit (INDEPENDENT_AMBULATORY_CARE_PROVIDER_SITE_OTHER): Payer: Medicare Other | Admitting: Family

## 2012-08-11 ENCOUNTER — Encounter: Payer: Self-pay | Admitting: Internal Medicine

## 2012-08-11 VITALS — BP 120/80 | HR 92 | Wt 198.0 lb

## 2012-08-11 DIAGNOSIS — Z9189 Other specified personal risk factors, not elsewhere classified: Secondary | ICD-10-CM

## 2012-08-11 DIAGNOSIS — Z789 Other specified health status: Secondary | ICD-10-CM

## 2012-08-11 DIAGNOSIS — Z85820 Personal history of malignant melanoma of skin: Secondary | ICD-10-CM

## 2012-08-11 NOTE — Progress Notes (Signed)
Subjective:    Patient ID: Jacqueline Mays, female    DOB: Feb 03, 1941, 71 y.o.   MRN: [id number]  HPI 71 year old white female, nonsmoker, patient of Dr. Lovell Sheehan is in today for recheck of incision on her right buttocks from a melanoma she have removed at [facility]. Changing dressing 4 times a day. She was seen at [facility] on 07/30/2012 and had 100 cc of clear drainage removed from that area. She's concerned because she's continuing to drain clear fluid. Denies any redness or, no fever, no warmness to touch, no pustular drainage. Saw Dr. Lenis Noon at [facility].    Review of Systems  Constitutional: Negative.   Respiratory: Negative.   Cardiovascular: Negative.   Musculoskeletal: Negative.  Negative for myalgias.  Skin:       Incision to the right buttocks with clear drainage.   Neurological: Negative.   Hematological: Negative.  Negative for adenopathy.  Psychiatric/Behavioral: Negative.    Past Medical History  Diagnosis Date  . Hyperlipidemia   . Thyroid disease     Hypothyroid  . Osteopenia   . Menopause   . Postmenopausal HRT (hormone replacement therapy)     History   Social History  . Marital Status: Married    Spouse Name: N/A    Number of Children: N/A  . Years of Education: N/A   Occupational History  . Not on file.   Social History Main Topics  . Smoking status: Never Smoker   . Smokeless tobacco: Not on file  . Alcohol Use: 2.4 oz/week    4 Glasses of wine per week  . Drug Use: No  . Sexually Active: Yes    Birth Control/ Protection: Post-menopausal   Other Topics Concern  . Not on file   Social History Narrative  . No narrative on file    Past Surgical History  Procedure Date  . Abdominal hysterectomy     TAH  . Spine surgery     LUMBAR LAMINECTOMY    Family History  Problem Relation Age of Onset  . Heart disease Mother   . Heart disease Father     Allergies  Allergen Reactions  . Codeine Sulfate     REACTION: unspecified     Current Outpatient Prescriptions on File Prior to Visit  Medication Sig Dispense Refill  . aspirin 81 MG tablet Take 81 mg by mouth daily.        . Cholecalciferol 1000 UNITS tablet Take 1,000 Units by mouth 2 (two) times daily.        . fluticasone (FLONASE) 50 MCG/ACT nasal spray Place 2 sprays into the nose daily.  16 g  11  . levothyroxine (SYNTHROID, LEVOTHROID) 50 MCG tablet Take 1 tablet (50 mcg total) by mouth daily.  90 tablet  3  . lisinopril (PRINIVIL,ZESTRIL) 10 MG tablet Take 1 tablet (10 mg total) by mouth daily.  90 tablet  3  . predniSONE (STERAPRED UNI-PAK) 5 MG TABS Take 5 mg by mouth daily.      . sertraline (ZOLOFT) 50 MG tablet TAKE 1 TABLET DAILY  90 tablet  PRN  . simvastatin (ZOCOR) 40 MG tablet Take 1 tablet (40 mg total) by mouth at bedtime.  90 tablet  3    BP 120/80  Pulse 92  Wt 198 lb (89.812 kg)  SpO2 94%chart    Objective:   Physical Exam  Constitutional: She is oriented to person, place, and time. She appears well-developed and well-nourished.  Neck: Normal range of motion. Neck  supple.  Pulmonary/Chest: Effort normal and breath sounds normal.  Neurological: She is alert and oriented to person, place, and time.  Skin: Skin is warm.       5 cm incision noted to the right gluteal area that is healing well. She has one small area that is draining clear drainage. No tenderness to palpation. No redness or signs of infection. No warmth.          Assessment & Plan:  Assessment: Melanoma right gluteal- h/o, S/p surgical removal  Plan: Phoned [facility] and left a message for Dr. Lenis Noon to determine if further intervention needs to be made. In the meantime, continue to change dressing as needed.

## 2012-08-11 NOTE — Telephone Encounter (Signed)
Returned your call. Pls call back. [phone]

## 2012-11-11 ENCOUNTER — Other Ambulatory Visit: Payer: Self-pay | Admitting: Internal Medicine

## 2012-11-21 ENCOUNTER — Other Ambulatory Visit: Payer: Self-pay | Admitting: Internal Medicine

## 2012-11-21 ENCOUNTER — Encounter: Payer: Self-pay | Admitting: Internal Medicine

## 2012-11-27 ENCOUNTER — Telehealth: Payer: Self-pay | Admitting: Internal Medicine

## 2012-11-27 MED ORDER — LEVOTHYROXINE SODIUM 50 MCG PO TABS: 50.0000 ug | ORAL_TABLET | Freq: Every day | ORAL | Status: DC

## 2012-11-27 NOTE — Telephone Encounter (Signed)
It looks like we approved a refill request for #90 levothyroxine for [facility]. Pt is in [place], however. Requesting we send levothyroxine 50 mcg for 30 days to [facility], [address], [place], [place]. Phone no.  [phone]. Their fax no. is [phone].  Thank you.

## 2012-12-24 ENCOUNTER — Encounter: Payer: Medicare Other | Admitting: Internal Medicine

## 2013-01-05 ENCOUNTER — Telehealth: Payer: Self-pay | Admitting: *Deleted

## 2013-01-05 NOTE — Telephone Encounter (Signed)
In [place]

## 2013-01-23 ENCOUNTER — Other Ambulatory Visit: Payer: Self-pay | Admitting: Internal Medicine

## 2013-03-09 ENCOUNTER — Encounter: Payer: Self-pay | Admitting: Internal Medicine

## 2013-05-13 ENCOUNTER — Other Ambulatory Visit: Payer: Self-pay

## 2013-06-26 ENCOUNTER — Ambulatory Visit (INDEPENDENT_AMBULATORY_CARE_PROVIDER_SITE_OTHER): Payer: Medicare Other | Admitting: Internal Medicine

## 2013-06-26 ENCOUNTER — Encounter: Payer: Self-pay | Admitting: Internal Medicine

## 2013-06-26 VITALS — BP 130/78 | HR 72 | Temp 98.0°F | Resp 16 | Ht 66.0 in | Wt 191.0 lb

## 2013-06-26 DIAGNOSIS — Z85820 Personal history of malignant melanoma of skin: Secondary | ICD-10-CM

## 2013-06-26 DIAGNOSIS — S34122D Incomplete lesion of L2 level of lumbar spinal cord, subsequent encounter: Secondary | ICD-10-CM

## 2013-06-26 DIAGNOSIS — E039 Hypothyroidism, unspecified: Secondary | ICD-10-CM

## 2013-06-26 DIAGNOSIS — Z5189 Encounter for other specified aftercare: Secondary | ICD-10-CM

## 2013-06-26 MED ORDER — LEVOTHYROXINE SODIUM 50 MCG PO TABS: 50.0000 ug | ORAL_TABLET | Freq: Every day | ORAL | Status: DC

## 2013-06-26 MED ORDER — SERTRALINE HCL 50 MG PO TABS: ORAL_TABLET | ORAL | Status: DC

## 2013-06-26 MED ORDER — SIMVASTATIN 40 MG PO TABS: ORAL_TABLET | ORAL | Status: DC

## 2013-06-26 NOTE — Patient Instructions (Signed)
The patient is instructed to continue all medications as prescribed. Schedule followup with check out clerk upon leaving the clinic  

## 2013-06-26 NOTE — Progress Notes (Signed)
Subjective:    Patient ID: Jacqueline Mays, female    DOB: 03-17-1941, 72 y.o.   MRN: [id number]  HPI Patient has a partially enhancing lesion at L2 that requires a repeat MRI.  She has had multiple skin melanoma so we feel that it is important that we repeat the MRI to be sure and that this is not a metastatic lesion.     Review of Systems  Constitutional: Negative for activity change, appetite change and fatigue.  HENT: Negative for ear pain, congestion, neck pain, postnasal drip and sinus pressure.   Eyes: Negative for redness and visual disturbance.  Respiratory: Negative for cough, shortness of breath and wheezing.   Gastrointestinal: Negative for abdominal pain and abdominal distention.  Genitourinary: Negative for dysuria, frequency and menstrual problem.  Musculoskeletal: Negative for myalgias, joint swelling and arthralgias.  Skin: Negative for rash and wound.  Neurological: Negative for dizziness, weakness and headaches.  Hematological: Negative for adenopathy. Does not bruise/bleed easily.  Psychiatric/Behavioral: Negative for sleep disturbance and decreased concentration.   Past Medical History  Diagnosis Date  . Hyperlipidemia   . Thyroid disease     Hypothyroid  . Osteopenia   . Menopause   . Postmenopausal HRT (hormone replacement therapy)     History   Social History  . Marital Status: Married    Spouse Name: N/A    Number of Children: N/A  . Years of Education: N/A   Occupational History  . Not on file.   Social History Main Topics  . Smoking status: Never Smoker   . Smokeless tobacco: Not on file  . Alcohol Use: 2.4 oz/week    4 Glasses of wine per week  . Drug Use: No  . Sexual Activity: Yes    Birth Control/ Protection: Post-menopausal   Other Topics Concern  . Not on file   Social History Narrative  . No narrative on file    Past Surgical History  Procedure Laterality Date  . Abdominal hysterectomy      TAH  . Spine surgery       LUMBAR LAMINECTOMY    Family History  Problem Relation Age of Onset  . Heart disease Mother   . Heart disease Father     Allergies  Allergen Reactions  . Codeine Sulfate     REACTION: unspecified    Current Outpatient Prescriptions on File Prior to Visit  Medication Sig Dispense Refill  . aspirin 81 MG tablet Take 81 mg by mouth daily.        . Cholecalciferol 1000 UNITS tablet Take 1,000 Units by mouth 2 (two) times daily.         No current facility-administered medications on file prior to visit.    BP 130/78  Pulse 72  Temp(Src) 98 F (36.7 C)  Resp 16  Ht 5\' 6"  (1.676 m)  Wt 191 lb (86.637 kg)  BMI 30.84 kg/m2       Objective:   Physical Exam  Nursing note and vitals reviewed. Constitutional: She is oriented to person, place, and time. She appears well-developed and well-nourished. No distress.  HENT:  Head: Normocephalic and atraumatic.  Right Ear: External ear normal.  Left Ear: External ear normal.  Nose: Nose normal.  Mouth/Throat: Oropharynx is clear and moist.  Eyes: Conjunctivae and EOM are normal. Pupils are equal, round, and reactive to light.  Neck: Normal range of motion. Neck supple. No JVD present. No tracheal deviation present. No thyromegaly present.  Cardiovascular: Normal rate, regular  rhythm, normal heart sounds and intact distal pulses.   No murmur heard. Pulmonary/Chest: Effort normal and breath sounds normal. She has no wheezes. She exhibits no tenderness.  Abdominal: Soft. Bowel sounds are normal.  Musculoskeletal: Normal range of motion. She exhibits no edema and no tenderness.  Lymphadenopathy:    She has no cervical adenopathy.  Neurological: She is alert and oriented to person, place, and time. She has normal reflexes. No cranial nerve deficit.  Skin: Skin is warm and dry. She is not diaphoretic.  Psychiatric: She has a normal mood and affect. Her behavior is normal.          Assessment & Plan:  Melanoma in  situ stable lipid Repeat the MRI due to the l2 lesion and the history ofd the melanoma

## 2013-07-28 ENCOUNTER — Telehealth: Payer: Self-pay | Admitting: Internal Medicine

## 2013-07-28 NOTE — Telephone Encounter (Signed)
Pt states she is currently in [place] and was instructed by PCP to have a MRI at facility there.  Pt calling to see if Dr. Lovell Sheehan has received the results of this test as she would like to know what they were.

## 2013-07-28 NOTE — Telephone Encounter (Signed)
Pt informed. Did not receive

## 2013-08-11 ENCOUNTER — Encounter: Payer: Self-pay | Admitting: Internal Medicine

## 2013-12-28 ENCOUNTER — Encounter: Payer: Medicare Other | Admitting: Internal Medicine

## 2014-02-12 ENCOUNTER — Encounter: Payer: Self-pay | Admitting: Internal Medicine

## 2014-02-12 ENCOUNTER — Encounter: Payer: Medicare Other | Admitting: Internal Medicine

## 2014-02-12 ENCOUNTER — Ambulatory Visit (INDEPENDENT_AMBULATORY_CARE_PROVIDER_SITE_OTHER): Payer: Medicare Other | Admitting: Internal Medicine

## 2014-02-12 VITALS — BP 140/80 | HR 68 | Temp 98.0°F | Ht 65.25 in | Wt 193.0 lb

## 2014-02-12 DIAGNOSIS — Z23 Encounter for immunization: Secondary | ICD-10-CM

## 2014-02-12 DIAGNOSIS — E039 Hypothyroidism, unspecified: Secondary | ICD-10-CM

## 2014-02-12 DIAGNOSIS — R739 Hyperglycemia, unspecified: Secondary | ICD-10-CM

## 2014-02-12 DIAGNOSIS — Z0000 Encounter for general adult medical examination without abnormal findings: Secondary | ICD-10-CM

## 2014-02-12 DIAGNOSIS — R7309 Other abnormal glucose: Secondary | ICD-10-CM

## 2014-02-12 DIAGNOSIS — E785 Hyperlipidemia, unspecified: Secondary | ICD-10-CM

## 2014-02-12 DIAGNOSIS — R030 Elevated blood-pressure reading, without diagnosis of hypertension: Secondary | ICD-10-CM

## 2014-02-12 DIAGNOSIS — T887XXA Unspecified adverse effect of drug or medicament, initial encounter: Secondary | ICD-10-CM

## 2014-02-12 DIAGNOSIS — R635 Abnormal weight gain: Secondary | ICD-10-CM

## 2014-02-12 LAB — CBC WITH DIFFERENTIAL/PLATELET
BASOS PCT: 0.6 % (ref 0.0–3.0)
Basophils Absolute: 0 10*3/uL (ref 0.0–0.1)
EOS ABS: 0.2 10*3/uL (ref 0.0–0.7)
EOS PCT: 1.9 % (ref 0.0–5.0)
HCT: 42.7 % (ref 36.0–46.0)
HEMOGLOBIN: 14 g/dL (ref 12.0–15.0)
LYMPHS PCT: 32 % (ref 12.0–46.0)
Lymphs Abs: 2.9 10*3/uL (ref 0.7–4.0)
MCHC: 32.8 g/dL (ref 30.0–36.0)
MCV: 80 fl (ref 78.0–100.0)
Monocytes Absolute: 0.6 10*3/uL (ref 0.1–1.0)
Monocytes Relative: 6.6 % (ref 3.0–12.0)
NEUTROS ABS: 5.3 10*3/uL (ref 1.4–7.7)
NEUTROS PCT: 58.9 % (ref 43.0–77.0)
Platelets: 269 10*3/uL (ref 150.0–400.0)
RBC: 5.34 Mil/uL — AB (ref 3.87–5.11)
RDW: 14 % (ref 11.5–15.5)
WBC: 9.1 10*3/uL (ref 4.0–10.5)

## 2014-02-12 LAB — COMPREHENSIVE METABOLIC PANEL
ALBUMIN: 4.3 g/dL (ref 3.5–5.2)
ALT: 34 U/L (ref 0–35)
AST: 25 U/L (ref 0–37)
Alkaline Phosphatase: 72 U/L (ref 39–117)
BUN: 16 mg/dL (ref 6–23)
CALCIUM: 9.6 mg/dL (ref 8.4–10.5)
CHLORIDE: 103 meq/L (ref 96–112)
CO2: 29 mEq/L (ref 19–32)
CREATININE: 0.8 mg/dL (ref 0.4–1.2)
GFR: 75.82 mL/min (ref >60.00)
GLUCOSE: 95 mg/dL (ref 70–99)
POTASSIUM: 4.2 meq/L (ref 3.5–5.1)
Sodium: 141 mEq/L (ref 135–145)
Total Bilirubin: 0.6 mg/dL (ref 0.2–1.2)
Total Protein: 7.5 g/dL (ref 6.0–8.3)

## 2014-02-12 LAB — TSH: TSH: 0.87 u[IU]/mL (ref 0.35–4.50)

## 2014-02-12 LAB — LIPID PANEL
CHOLESTEROL: 203 mg/dL — AB (ref 0–200)
HDL: 48.2 mg/dL (ref >39.00)
LDL Cholesterol: 96 mg/dL (ref 0–99)
TRIGLYCERIDES: 295 mg/dL — AB (ref 0.0–149.0)
Total CHOL/HDL Ratio: 4
VLDL: 59 mg/dL — ABNORMAL HIGH (ref 0.0–40.0)

## 2014-02-12 LAB — HEMOGLOBIN A1C: Hgb A1c MFr Bld: 6.4 % (ref 4.6–6.5)

## 2014-02-12 NOTE — Patient Instructions (Signed)
The patient is instructed to continue all medications as prescribed. Schedule followup with check out clerk upon leaving the clinic  

## 2014-02-12 NOTE — Progress Notes (Signed)
Pre visit review using our clinic review tool, if applicable. No additional management support is needed unless otherwise documented below in the visit note. 

## 2014-02-12 NOTE — Addendum Note (Signed)
Addended by: Ricard Dillon on: 02/12/2014 10:33 AM   Modules accepted: Orders

## 2014-02-12 NOTE — Progress Notes (Signed)
Subjective:    Jacqueline Mays is a 73 y.o. female who presents for Medicare Annual/Subsequent preventive examination.  Preventive Screening-Counseling & Management  Tobacco History  Smoking status  . Never Smoker   Smokeless tobacco  . Not on file     Problems Prior to Visit 1.   Current Problems (verified) Patient Active Problem List   Diagnosis Date Noted  . History of melanoma 06/25/2012  . 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 LIPOMA 05/30/2009  . WEIGHT GAIN, ABNORMAL 02/21/2009  . ELEVATED BLOOD PRESSURE WITHOUT DIAGNOSIS OF HYPERTENSION 02/21/2009  . PLANTAR WART, LEFT 06/04/2008  . INSOMNIA 06/04/2008  . LUMBAR RADICULOPATHY, left 04/08/2008  . UNS ADVRS EFF UNS RX MEDICINAL&BIOLOGICAL SBSTNC 04/08/2008  . HYPOTHYROIDISM 06/19/2007  . HYPERLIPIDEMIA 06/19/2007  . OSTEOPENIA 06/19/2007    Medications Prior to Visit Current Outpatient Prescriptions on File Prior to Visit  Medication Sig Dispense Refill  . aspirin 81 MG tablet Take 81 mg by mouth daily.        . Cholecalciferol 1000 UNITS tablet Take 1,000 Units by mouth 2 (two) times daily.        Marland Kitchen levothyroxine (SYNTHROID, LEVOTHROID) 50 MCG tablet Take 1 tablet (50 mcg total) by mouth daily before breakfast.  90 tablet  3  . sertraline (ZOLOFT) 50 MG tablet TAKE 1 TABLET DAILY  90 tablet  PRN  . simvastatin (ZOCOR) 40 MG tablet TAKE 1 TABLET DAILY  90 tablet  PRN   No current facility-administered medications on file prior to visit.    Current Medications (verified) Current Outpatient Prescriptions  Medication Sig Dispense Refill  . aspirin 81 MG tablet Take 81 mg by mouth daily.        . Cholecalciferol 1000 UNITS tablet Take 1,000 Units by mouth 2 (two) times daily.        Marland Kitchen levothyroxine (SYNTHROID, LEVOTHROID) 50 MCG tablet Take 1 tablet (50 mcg total) by mouth daily before breakfast.  90 tablet  3  . sertraline (ZOLOFT) 50 MG tablet TAKE 1 TABLET DAILY  90 tablet  PRN  . simvastatin (ZOCOR) 40 MG  tablet TAKE 1 TABLET DAILY  90 tablet  PRN   No current facility-administered medications for this visit.     Allergies (verified) Codeine sulfate   PAST HISTORY  Family History Family History  Problem Relation Age of Onset  . Heart disease Mother   . Heart disease Father     Social History History  Substance Use Topics  . Smoking status: Never Smoker   . Smokeless tobacco: Not on file  . Alcohol Use: 2.4 oz/week    4 Glasses of wine per week     Are there smokers in your home (other than you)? No  Risk Factors Current exercise habits: The patient does not participate in regular exercise at present.  Dietary issues discussed: weight managemenrt   Cardiac risk factors: advanced age (older than 67 for men, 58 for women), dyslipidemia and hypertension.  Depression Screen (Note: if answer to either of the following is "Yes", a more complete depression screening is indicated)   Over the past two weeks, have you felt down, depressed or hopeless? No  Over the past two weeks, have you felt little interest or pleasure in doing things? No  Have you lost interest or pleasure in daily life? No  Do you often feel hopeless? No  Do you cry easily over simple problems? No  Activities of Daily Living In your present state of health, do you  have any difficulty performing the following activities?:  Driving? No Managing money?  No Feeding yourself? No Getting from bed to chair? No Climbing a flight of stairs? No Preparing food and eating?: No Bathing or showering? No Getting dressed: No Getting to the toilet? No Using the toilet:No Moving around from place to place: No In the past year have you fallen or had a near fall?:No   Are you sexually active?  Yes  Do you have more than one partner?  No  Hearing Difficulties: No Do you often ask people to speak up or repeat themselves? No Do you experience ringing or noises in your ears? No Do you have difficulty understanding soft  or whispered voices? No   Do you feel that you have a problem with memory? No  Do you often misplace items? No  Do you feel safe at home?  Yes  Cognitive Testing  Alert? Yes  Normal Appearance?Yes  Oriented to person? Yes  Place? Yes   Time? Yes  Recall of three objects?  Yes  Can perform simple calculations? Yes  Displays appropriate judgment?Yes  Can read the correct time from a watch face?Yes   Advanced Directives have been discussed with the patient? Yes  List the Names of Other Physician/Practitioners you currently use: 1.    Indicate any recent Medical Services you may have received from other than [facility] providers in the past year (date may be approximate).  Immunization History  Administered Date(s) Administered  . Influenza Split 07/03/2012, 06/26/2013  . Influenza Whole 08/05/2007, 08/10/2009, 08/09/2010, 05/26/2011  . Pneumococcal Polysaccharide-23 10/08/2005  . Td 06/08/1998, 08/10/2008  . Zoster 03/08/2011    Screening Tests Health Maintenance  Topic Date Due  . Pneumococcal Polysaccharide Vaccine Age 59 And Over  02/02/2006  . Mammogram  12/21/2012  . Influenza Vaccine  05/08/2014  . Tetanus/tdap  08/10/2018  . Colonoscopy  10/11/2018  . Zostavax  Completed    All answers were reviewed with the patient and necessary referrals were made:  Georgetta Haber, MD   02/12/2014   History reviewed: allergies, current medications, past family history, past medical history, past social history, past surgical history and problem list  Review of Systems Pertinent items are noted in HPI.    Objective:     Vision by Snellen chart: right eye:20/20, left eye:20/20  Body mass index is 31.88 kg/(m^2). BP 140/80  Pulse 68  Temp(Src) 98 F (36.7 C) (Oral)  Ht 5' 5.25" (1.657 m)  Wt 193 lb (87.544 kg)  BMI 31.88 kg/m2  Exam per problem focus     Assessment:      This is a routine wellness  examination for this patient . I reviewed all health maintenance  protocols including mammography, colonoscopy, bone density Needed referrals were placed. Age and diagnosis  appropriate screening labs were ordered. Her immunization history was reviewed and appropriate vaccinations were ordered. Her current medications and allergies were reviewed and needed refills of her chronic medications were ordered. The plan for yearly health maintenance was discussed all orders and referrals were made as appropriate.      Plan:     During the course of the visit the patient was educated and counseled about appropriate screening and preventive services including:    Pneumococcal vaccine   Td vaccine  Diet review for nutrition referral? Yes ____  Not Indicated ____   Patient Instructions (the written plan) was given to the patient.  Medicare Attestation I have personally reviewed: The patient's medical  and social history Their use of alcohol, tobacco or illicit drugs Their current medications and supplements The patient's functional ability including ADLs,fall risks, home safety risks, cognitive, and hearing and visual impairment Diet and physical activities Evidence for depression or mood disorders  The patient's weight, height, BMI, and visual acuity have been recorded in the chart.  I have made referrals, counseling, and provided education to the patient based on review of the above and I have provided the patient with a written personalized care plan for preventive services.     Georgetta Haber, MD   02/12/2014

## 2014-02-12 NOTE — Progress Notes (Signed)
Subjective:    Patient ID: Jacqueline Mays, female    DOB: 07-23-1941, 73 y.o.   MRN: [id number]  HPI  HYpothyroidism Lipid management Weight management HTN And need for exercise history of melanoma Review of Systems  Constitutional: Negative for activity change, appetite change and fatigue.  HENT: Negative for congestion, ear pain, postnasal drip and sinus pressure.   Eyes: Negative for redness and visual disturbance.  Respiratory: Negative for cough, shortness of breath and wheezing.   Gastrointestinal: Negative for abdominal pain and abdominal distention.  Genitourinary: Negative for dysuria, frequency and menstrual problem.  Musculoskeletal: Negative for arthralgias, joint swelling, myalgias and neck pain.  Skin: Negative for rash and wound.  Neurological: Negative for dizziness, weakness and headaches.  Hematological: Negative for adenopathy. Does not bruise/bleed easily.  Psychiatric/Behavioral: Negative for sleep disturbance and decreased concentration.   Past Medical History  Diagnosis Date  . Hyperlipidemia   . Thyroid disease     Hypothyroid  . Osteopenia   . Menopause   . Postmenopausal HRT (hormone replacement therapy)     History   Social History  . Marital Status: Married    Spouse Name: N/A    Number of Children: N/A  . Years of Education: N/A   Occupational History  . Not on file.   Social History Main Topics  . Smoking status: Never Smoker   . Smokeless tobacco: Not on file  . Alcohol Use: 2.4 oz/week    4 Glasses of wine per week  . Drug Use: No  . Sexual Activity: Yes    Birth Control/ Protection: Post-menopausal   Other Topics Concern  . Not on file   Social History Narrative  . No narrative on file    Past Surgical History  Procedure Laterality Date  . Abdominal hysterectomy      TAH  . Spine surgery      LUMBAR LAMINECTOMY    Family History  Problem Relation Age of Onset  . Heart disease Mother   . Heart disease  Father     Allergies  Allergen Reactions  . Codeine Sulfate     REACTION: unspecified    Current Outpatient Prescriptions on File Prior to Visit  Medication Sig Dispense Refill  . aspirin 81 MG tablet Take 81 mg by mouth daily.        . Cholecalciferol 1000 UNITS tablet Take 1,000 Units by mouth 2 (two) times daily.        Marland Kitchen levothyroxine (SYNTHROID, LEVOTHROID) 50 MCG tablet Take 1 tablet (50 mcg total) by mouth daily before breakfast.  90 tablet  3  . sertraline (ZOLOFT) 50 MG tablet TAKE 1 TABLET DAILY  90 tablet  PRN  . simvastatin (ZOCOR) 40 MG tablet TAKE 1 TABLET DAILY  90 tablet  PRN   No current facility-administered medications on file prior to visit.    BP 140/80  Pulse 68  Temp(Src) 98 F (36.7 C) (Oral)  Ht 5' 5.25" (1.657 m)  Wt 193 lb (87.544 kg)  BMI 31.88 kg/m2         Objective:   Physical Exam  Nursing note and vitals reviewed. Constitutional: She is oriented to person, place, and time. She appears well-developed and well-nourished. No distress.  HENT:  Head: Normocephalic and atraumatic.  Eyes: Conjunctivae and EOM are normal. Pupils are equal, round, and reactive to light.  Neck: Normal range of motion. Neck supple. No JVD present. No tracheal deviation present. No thyromegaly present.  Cardiovascular: Normal rate  and regular rhythm.   Murmur heard. Pulmonary/Chest: Effort normal and breath sounds normal. She has no wheezes. She exhibits no tenderness.  Abdominal: Soft. Bowel sounds are normal.  Musculoskeletal: Normal range of motion. She exhibits no edema and no tenderness.  Lymphadenopathy:    She has no cervical adenopathy.  Neurological: She is alert and oriented to person, place, and time. She has normal reflexes. No cranial nerve deficit.  Skin: Skin is warm and dry. She is not diaphoretic.  Psychiatric: She has a normal mood and affect. Her behavior is normal.          Assessment & Plan:  HTN Stable Weight gain Lipid  management Skin cancer surveillance

## 2014-02-12 NOTE — Addendum Note (Signed)
Addended by: Westley Hummer B on: 02/12/2014 10:48 AM   Modules accepted: Orders

## 2014-02-17 ENCOUNTER — Other Ambulatory Visit: Payer: Self-pay | Admitting: Internal Medicine

## 2014-04-06 ENCOUNTER — Other Ambulatory Visit: Payer: Self-pay | Admitting: Internal Medicine

## 2014-04-30 ENCOUNTER — Encounter: Payer: Self-pay | Admitting: Internal Medicine

## 2014-05-01 MED ORDER — SERTRALINE HCL 50 MG PO TABS: ORAL_TABLET | ORAL | Status: DC

## 2014-05-01 MED ORDER — SIMVASTATIN 40 MG PO TABS: ORAL_TABLET | ORAL | Status: DC

## 2014-05-01 MED ORDER — LEVOTHYROXINE SODIUM 50 MCG PO TABS: 50.0000 ug | ORAL_TABLET | Freq: Every day | ORAL | Status: DC

## 2014-05-01 NOTE — Telephone Encounter (Signed)
Rx sent to pharmacy   

## 2014-06-16 ENCOUNTER — Encounter: Payer: Self-pay | Admitting: Gastroenterology

## 2014-09-20 ENCOUNTER — Telehealth: Payer: Self-pay

## 2014-09-20 MED ORDER — SERTRALINE HCL 50 MG PO TABS: ORAL_TABLET | ORAL | Status: DC

## 2014-09-20 NOTE — Telephone Encounter (Signed)
Yes and please give her 1 refill at least.

## 2014-09-20 NOTE — Telephone Encounter (Signed)
Medication refilled

## 2014-09-20 NOTE — Telephone Encounter (Signed)
Rx request for sertraline 50 mg tablet- Take 1 tablet daily #90  Pharm:  [place] [facility]. Pt will establish with [facility] in March 2016.

## 2014-09-20 NOTE — Telephone Encounter (Signed)
Is this ok Dr. Hunter? 

## 2014-09-23 ENCOUNTER — Other Ambulatory Visit: Payer: Self-pay | Admitting: *Deleted

## 2014-09-23 MED ORDER — SERTRALINE HCL 50 MG PO TABS: ORAL_TABLET | ORAL | Status: DC

## 2014-12-10 ENCOUNTER — Telehealth: Payer: Self-pay | Admitting: Internal Medicine

## 2014-12-10 NOTE — Telephone Encounter (Signed)
Pt and husband have been living in [place] since their last visit w/ dr Arnoldo Morale. (retired and traveling) Dr Arnoldo Morale had given them enough med to get through this summer. Both have been w/ [facility] 20 yrs. They both had appts on 3/18 to est w/ you, but now they will not be back until June. Pt states they will be back to stay. Is it ok to get them in in July or august to est w. you?  Otherwise it will be march/April 2017?

## 2014-12-10 NOTE — Telephone Encounter (Signed)
Yes July or august is ok. See if there are others that can be moved into the spots they will not be able to fill.

## 2014-12-22 ENCOUNTER — Ambulatory Visit: Payer: Medicare Other | Admitting: Family Medicine

## 2015-03-18 ENCOUNTER — Other Ambulatory Visit: Payer: Self-pay | Admitting: Family Medicine

## 2015-04-04 ENCOUNTER — Other Ambulatory Visit: Payer: Self-pay

## 2015-06-29 ENCOUNTER — Ambulatory Visit: Payer: Medicare Other | Admitting: Family Medicine

## 2015-10-06 ENCOUNTER — Encounter: Payer: Self-pay | Admitting: *Deleted

## 2022-01-06 DEATH — deceased

## 2022-07-02 ENCOUNTER — Encounter: Payer: Self-pay | Admitting: *Deleted

## 2022-09-20 ENCOUNTER — Encounter: Payer: Self-pay | Admitting: *Deleted
